# Patient Record
Sex: Female | Born: 2000 | Race: White | Hispanic: No | Marital: Single | Smoking: Never smoker
Health system: Southern US, Community
[De-identification: ages and names within clinical notes are randomized; demographics above are authoritative.]

## PROBLEM LIST (undated history)

## (undated) DIAGNOSIS — F32A Depression, unspecified: Secondary | ICD-10-CM

## (undated) DIAGNOSIS — N946 Dysmenorrhea, unspecified: Secondary | ICD-10-CM

## (undated) HISTORY — DX: Dysmenorrhea, unspecified: N94.6

## (undated) HISTORY — PX: HERNIA REPAIR: SHX51

## (undated) HISTORY — DX: Depression, unspecified: F32.A

---
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
## 2023-01-07 ENCOUNTER — Ambulatory Visit (INDEPENDENT_AMBULATORY_CARE_PROVIDER_SITE_OTHER): Payer: 59 | Admitting: Nurse Practitioner

## 2023-01-07 ENCOUNTER — Encounter: Payer: Self-pay | Admitting: Nurse Practitioner

## 2023-01-07 VITALS — BP 110/70 | HR 87 | Ht 63.0 in | Wt 125.0 lb

## 2023-01-07 DIAGNOSIS — Z30011 Encounter for initial prescription of contraceptive pills: Secondary | ICD-10-CM

## 2023-01-07 DIAGNOSIS — Z113 Encounter for screening for infections with a predominantly sexual mode of transmission: Secondary | ICD-10-CM

## 2023-01-07 DIAGNOSIS — F3281 Premenstrual dysphoric disorder: Secondary | ICD-10-CM | POA: Diagnosis not present

## 2023-01-07 DIAGNOSIS — N946 Dysmenorrhea, unspecified: Secondary | ICD-10-CM | POA: Diagnosis not present

## 2023-01-07 MED ORDER — NORETHIN ACE-ETH ESTRAD-FE 1-20 MG-MCG PO TABS: 1.0000 | ORAL_TABLET | Freq: Every day | ORAL | 1 refills | Status: DC

## 2023-01-07 NOTE — Progress Notes (Signed)
   Acute Office Visit  Subjective:    Patient ID: Julie Mcclure, female    DOB: 05/08/2001, 22 y.o.   MRN: [id number]   HPI 22 y.o. G0 presents as new patient for dysmenorrhea and mood changes. No history of diagnosed anxiety or depression. Has noticed the last 6+ months that she becomes very depressed about a week before her menses that will last through her period. Denies SI. Also feels very anxious and overwhelmed, can have intrusive thoughts. In school at [employer], works at [employer], living with parents. Does not feel she has any reason to be depressed and has noticed it occurs cyclically. Cycles are monthly. Pain radiates to back and even into rectum. Bleeding is moderate. Would like STD screening today, declines HIV, RPR.   Patient's last menstrual period was 12/23/2022. Period Cycle (Days): 28 Period Pattern: Regular Menstrual Flow: Moderate Menstrual Control: Tampon Menstrual Control Change Freq (Hours): 3 Dysmenorrhea: (!) Severe Dysmenorrhea Symptoms: Nausea, Cramping, Throbbing (severe back pain that caused her to pass out.)  Review of Systems  Constitutional: Negative.   Genitourinary:  Positive for menstrual problem.  Psychiatric/Behavioral:  Positive for dysphoric mood. Negative for self-injury and suicidal ideas. The patient is nervous/anxious.        Objective:    Physical Exam Constitutional:      Appearance: Normal appearance.  Genitourinary:    General: Normal vulva.  Psychiatric:        Attention and Perception: Attention normal.        Mood and Affect: Mood is anxious.        Speech: Speech normal.        Behavior: Behavior normal.     BP 110/70   Pulse 87   Ht 5\' 3"  (1.6 m)   Wt 125 lb (56.7 kg)   LMP 12/23/2022   SpO2 100%   BMI 22.14 kg/m  Wt Readings from Last 3 Encounters:  01/07/23 125 lb (56.7 kg) (45 %, Z= -0.14)*   * Growth percentiles are based on CDC (Girls, 2-20 Years) data.        Patient informed chaperone available to be  present for breast and/or pelvic exam. Patient has requested no chaperone to be present. Patient has been advised what will be completed during breast and pelvic exam.   Assessment & Plan:   Problem List Items Addressed This Visit   None Visit Diagnoses     PMDD (premenstrual dysphoric disorder)    -  Primary   Relevant Medications   norethindrone-ethinyl estradiol-FE (LOESTRIN FE) 1-20 MG-MCG tablet   Encounter for initial prescription of contraceptive pills       Relevant Medications   norethindrone-ethinyl estradiol-FE (LOESTRIN FE) 1-20 MG-MCG tablet   Screening examination for STD (sexually transmitted disease)       Relevant Orders   SURESWAB CT/NG/T. vaginalis   Dysmenorrhea       Relevant Medications   norethindrone-ethinyl estradiol-FE (LOESTRIN FE) 1-20 MG-MCG tablet      Plan: Discussed PMDD and management with hormonal contraception and/or SSRI. She would like to try birth control. Educated on proper use, slight risk for blood clots. Will start with first day of next menses. Option to take continuously if still experiencing symptoms during placebo week.  Ibuprofen as needed for cramping. STD panel pending. Follow up in 3 months.      Olivia Mackie DNP, 3:04 PM 01/07/2023

## 2023-01-08 LAB — SURESWAB CT/NG/T. VAGINALIS
C. trachomatis RNA, TMA: NOT DETECTED
N. gonorrhoeae RNA, TMA: NOT DETECTED
Trichomonas vaginalis RNA: NOT DETECTED

## 2023-04-12 ENCOUNTER — Ambulatory Visit: Payer: 59 | Admitting: Nurse Practitioner

## 2023-11-07 ENCOUNTER — Other Ambulatory Visit: Payer: Self-pay

## 2023-11-07 ENCOUNTER — Encounter (HOSPITAL_COMMUNITY): Payer: Self-pay

## 2023-11-07 ENCOUNTER — Emergency Department (HOSPITAL_COMMUNITY)
Admission: EM | Admit: 2023-11-07 | Discharge: 2023-11-08 | Disposition: A | Discharge status: Other Acute Inpatient Hospital | Source: Home / Self Care | Attending: Emergency Medicine | Admitting: Emergency Medicine

## 2023-11-07 DIAGNOSIS — F2081 Schizophreniform disorder: Secondary | ICD-10-CM | POA: Diagnosis not present

## 2023-11-07 DIAGNOSIS — F332 Major depressive disorder, recurrent severe without psychotic features: Secondary | ICD-10-CM | POA: Insufficient documentation

## 2023-11-07 DIAGNOSIS — F4310 Post-traumatic stress disorder, unspecified: Secondary | ICD-10-CM | POA: Diagnosis present

## 2023-11-07 DIAGNOSIS — R45851 Suicidal ideations: Secondary | ICD-10-CM | POA: Insufficient documentation

## 2023-11-07 DIAGNOSIS — F333 Major depressive disorder, recurrent, severe with psychotic symptoms: Secondary | ICD-10-CM | POA: Diagnosis present

## 2023-11-07 DIAGNOSIS — F419 Anxiety disorder, unspecified: Secondary | ICD-10-CM | POA: Insufficient documentation

## 2023-11-07 LAB — COMPREHENSIVE METABOLIC PANEL WITH GFR
ALT: 16 U/L (ref 0–44)
AST: 21 U/L (ref 15–41)
Albumin: 5 g/dL (ref 3.5–5.0)
Alkaline Phosphatase: 70 U/L (ref 38–126)
Anion gap: 9 (ref 5–15)
BUN: 10 mg/dL (ref 6–20)
CO2: 23 mmol/L (ref 22–32)
Calcium: 9.2 mg/dL (ref 8.9–10.3)
Chloride: 103 mmol/L (ref 98–111)
Creatinine, Ser: 0.6 mg/dL (ref 0.44–1.00)
GFR, Estimated: >60 mL/min (ref >60)
Glucose, Bld: 85 mg/dL (ref 70–99)
Potassium: 3.4 mmol/L — ABNORMAL LOW (ref 3.5–5.1)
Sodium: 135 mmol/L (ref 135–145)
Total Bilirubin: 0.8 mg/dL (ref 0.0–1.2)
Total Protein: 8.8 g/dL — ABNORMAL HIGH (ref 6.5–8.1)

## 2023-11-07 LAB — CBC
HCT: 47.2 % — ABNORMAL HIGH (ref 36.0–46.0)
Hemoglobin: 15.8 g/dL — ABNORMAL HIGH (ref 12.0–15.0)
MCH: 29.8 pg (ref 26.0–34.0)
MCHC: 33.5 g/dL (ref 30.0–36.0)
MCV: 88.9 fL (ref 80.0–100.0)
Platelets: 289 10*3/uL (ref 150–400)
RBC: 5.31 MIL/uL — ABNORMAL HIGH (ref 3.87–5.11)
RDW: 12.3 % (ref 11.5–15.5)
WBC: 6.9 10*3/uL (ref 4.0–10.5)
nRBC: 0 % (ref 0.0–0.2)

## 2023-11-07 LAB — RAPID URINE DRUG SCREEN, HOSP PERFORMED
Amphetamines: NOT DETECTED
Barbiturates: NOT DETECTED
Benzodiazepines: NOT DETECTED
Cocaine: NOT DETECTED
Opiates: NOT DETECTED
Tetrahydrocannabinol: NOT DETECTED

## 2023-11-07 LAB — ETHANOL: Alcohol, Ethyl (B): <10 mg/dL (ref <10)

## 2023-11-07 LAB — HCG, SERUM, QUALITATIVE: Preg, Serum: NEGATIVE

## 2023-11-07 LAB — ACETAMINOPHEN LEVEL: Acetaminophen (Tylenol), Serum: <10 ug/mL — ABNORMAL LOW (ref 10–30)

## 2023-11-07 LAB — SALICYLATE LEVEL: Salicylate Lvl: <7 mg/dL — ABNORMAL LOW (ref 7.0–30.0)

## 2023-11-07 MED ORDER — ACETAMINOPHEN 325 MG PO TABS
650.0000 mg | ORAL_TABLET | ORAL | Status: DC | PRN
  Administered 2023-11-08: 650 mg via ORAL
  Filled 2023-11-07: qty 2
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
## 2023-11-07 NOTE — ED Notes (Signed)
 Pt has been changed out and belongings have been placed in two bags and are located in the cabinets in triage below ice machine. Pt has been wanded by security.

## 2023-11-07 NOTE — BH Assessment (Signed)
 Patient was deferred to [facility] for a telepsych assessment. The assigned care coordinator will provide updates regarding the scheduling of the assessment. [facility]IS coordinator can be reached at [phone] for further information on the timing of the telepsych evaluation.

## 2023-11-07 NOTE — ED Provider Notes (Signed)
 [place] EMERGENCY DEPARTMENT AT [facility] Provider Note   CSN: [id number] Arrival date & time: 11/07/23  1941     History Chief Complaint  Patient presents with   Psychiatric Evaluation    HPI Julie Mcclure is a 23 y.o. female presenting for feelings of wanting to hurt herself.  SI and paranoia that someone is gonna come after her and that the apps on her phone are targeting her personally. Has been off medications for a while  Patient's recorded medical, surgical, social, medication list and allergies were reviewed in the Snapshot window as part of the initial history.   Review of Systems   Review of Systems  Constitutional:  Negative for chills and fever.  HENT:  Negative for ear pain and sore throat.   Eyes:  Negative for pain and visual disturbance.  Respiratory:  Negative for cough and shortness of breath.   Cardiovascular:  Negative for chest pain and palpitations.  Gastrointestinal:  Negative for abdominal pain and vomiting.  Genitourinary:  Negative for dysuria and hematuria.  Musculoskeletal:  Negative for arthralgias and back pain.  Skin:  Negative for color change and rash.  Neurological:  Negative for seizures and syncope.  Psychiatric/Behavioral:  Positive for agitation, behavioral problems and suicidal ideas. The patient is nervous/anxious.   All other systems reviewed and are negative.   Physical Exam Updated Vital Signs BP (!) 190/95 (BP Location: Left Arm)   Pulse (!) 105   Resp 20   Ht 5\' 3"  (1.6 m)   Wt 55.8 kg   LMP 10/06/2023   SpO2 99%   BMI 21.79 kg/m  Physical Exam Constitutional:      General: She is not in acute distress.    Appearance: She is not ill-appearing or toxic-appearing.  HENT:     Head: Normocephalic and atraumatic.  Eyes:     Extraocular Movements: Extraocular movements intact.     Pupils: Pupils are equal, round, and reactive to light.  Cardiovascular:     Rate and Rhythm: Normal rate.  Pulmonary:      Effort: No respiratory distress.  Abdominal:     General: Abdomen is flat.  Musculoskeletal:        General: No swelling, deformity or signs of injury.     Cervical back: Normal range of motion. No rigidity.  Skin:    General: Skin is warm and dry.  Neurological:     General: No focal deficit present.     Mental Status: She is alert and oriented to person, place, and time.  Psychiatric:        Mood and Affect: Mood normal.      ED Course/ Medical Decision Making/ A&P    Procedures Procedures   Medications Ordered in ED Medications  acetaminophen (TYLENOL) tablet 650 mg (has no administration in time range)   Medical Decision Making:   Julie Mcclure is a 23 y.o. female who presented to the ED today for psychiatric evaluation.  Patient is endorsing paranoia/SI.  Patient does have a history of similar for which they are on multiple medications.  They are not compliant with their medications.  On my initial exam, the pt was linear in thought, dysphoric in affect, and overall well-appearing.  Vital signs reviewed and reassuring.  Reviewed and confirmed nursing documentation for past medical history, family history, social history.    Initial Assessment:   This is most consistent with an acute life threatening illness. With the patient's presentation of SI/AH, patient warrants  emergent psychiatric consultation.  Differential includes primary psychosis, substance-induced psychosis, mood disturbance.  Initial Plan:  Patient immediately placed into ED psychiatric hold protocol including suicide precautions, elopement precautions and vital sign monitoring.    Emergent behavioral health hold signed and notarized while awaiting psychiatric consultation due to threat to self or others.  Psychiatry consulted for further evaluation once patient medically cleared.  Medical screening evaluation ordered and reviewed with no obvious medical reason to postpone psychiatric evaluation.  Patient  is voluntary at this time.  No IVC.  May need to be reassessed if capacity is changing.     Final Assessment and Plan:   Placed into psychiatric hold protocol and arranged for consultation. Medically cleared for psychiatric disposition.    Clinical Impression:  1. Anxiety   2. Suicidal ideation      Data Unavailable   Final Clinical Impression(s) / ED Diagnoses Final diagnoses:  Anxiety  Suicidal ideation    Rx / DC Orders ED Discharge Orders     None         Glyn Ade, MD 11/07/23 2029

## 2023-11-07 NOTE — ED Notes (Signed)
 Sent Red (pregnancy), Dark Green (ethanol) and lt green and lavendar (cbc and metabolic panel) to lab. Sent at 2015.

## 2023-11-08 ENCOUNTER — Inpatient Hospital Stay (HOSPITAL_COMMUNITY)
Admission: AD | Admit: 2023-11-08 | Discharge: 2023-11-16 | DRG: 885 | Disposition: A | Discharge status: Home / Self Care | Source: Intra-hospital | Attending: Psychiatry | Admitting: Psychiatry

## 2023-11-08 ENCOUNTER — Encounter (HOSPITAL_COMMUNITY): Payer: Self-pay | Admitting: Psychiatry

## 2023-11-08 ENCOUNTER — Encounter (HOSPITAL_COMMUNITY): Payer: Self-pay | Admitting: Nurse Practitioner

## 2023-11-08 DIAGNOSIS — F410 Panic disorder [episodic paroxysmal anxiety] without agoraphobia: Secondary | ICD-10-CM | POA: Diagnosis present

## 2023-11-08 DIAGNOSIS — R45851 Suicidal ideations: Secondary | ICD-10-CM

## 2023-11-08 DIAGNOSIS — F4310 Post-traumatic stress disorder, unspecified: Secondary | ICD-10-CM

## 2023-11-08 DIAGNOSIS — F411 Generalized anxiety disorder: Secondary | ICD-10-CM | POA: Diagnosis present

## 2023-11-08 DIAGNOSIS — F17200 Nicotine dependence, unspecified, uncomplicated: Secondary | ICD-10-CM | POA: Diagnosis present

## 2023-11-08 DIAGNOSIS — E559 Vitamin D deficiency, unspecified: Secondary | ICD-10-CM | POA: Diagnosis present

## 2023-11-08 DIAGNOSIS — F17290 Nicotine dependence, other tobacco product, uncomplicated: Secondary | ICD-10-CM | POA: Diagnosis present

## 2023-11-08 DIAGNOSIS — F333 Major depressive disorder, recurrent, severe with psychotic symptoms: Secondary | ICD-10-CM | POA: Diagnosis present

## 2023-11-08 DIAGNOSIS — G4700 Insomnia, unspecified: Secondary | ICD-10-CM | POA: Diagnosis present

## 2023-11-08 DIAGNOSIS — F2081 Schizophreniform disorder: Secondary | ICD-10-CM | POA: Diagnosis present

## 2023-11-08 DIAGNOSIS — Z818 Family history of other mental and behavioral disorders: Secondary | ICD-10-CM

## 2023-11-08 DIAGNOSIS — F332 Major depressive disorder, recurrent severe without psychotic features: Secondary | ICD-10-CM | POA: Diagnosis not present

## 2023-11-08 DIAGNOSIS — Z79899 Other long term (current) drug therapy: Secondary | ICD-10-CM

## 2023-11-08 DIAGNOSIS — F23 Brief psychotic disorder: Secondary | ICD-10-CM | POA: Diagnosis present

## 2023-11-08 MED ORDER — VENLAFAXINE HCL ER 37.5 MG PO CP24
37.5000 mg | ORAL_CAPSULE | Freq: Every day | ORAL | Status: DC
  Administered 2023-11-09: 37.5 mg via ORAL
  Filled 2023-11-08 (×3): qty 1

## 2023-11-08 MED ORDER — HYDROXYZINE HCL 25 MG PO TABS
25.0000 mg | ORAL_TABLET | Freq: Three times a day (TID) | ORAL | Status: DC | PRN
  Administered 2023-11-08 – 2023-11-14 (×4): 25 mg via ORAL
  Filled 2023-11-08 (×5): qty 1

## 2023-11-08 MED ORDER — HALOPERIDOL 5 MG PO TABS: 5.0000 mg | ORAL_TABLET | Freq: Three times a day (TID) | ORAL | Status: DC | PRN

## 2023-11-08 MED ORDER — NICOTINE POLACRILEX 2 MG MT GUM
2.0000 mg | CHEWING_GUM | OROMUCOSAL | Status: DC | PRN
  Administered 2023-11-09: 2 mg via ORAL

## 2023-11-08 MED ORDER — ALUM & MAG HYDROXIDE-SIMETH 200-200-20 MG/5ML PO SUSP: 30.0000 mL | ORAL | Status: DC | PRN

## 2023-11-08 MED ORDER — VENLAFAXINE HCL ER 75 MG PO CP24
75.0000 mg | ORAL_CAPSULE | Freq: Every day | ORAL | Status: DC
  Administered 2023-11-08: 75 mg via ORAL
  Filled 2023-11-08: qty 1

## 2023-11-08 MED ORDER — AMOXICILLIN 875 MG PO TABS: 875.0000 mg | ORAL_TABLET | Freq: Two times a day (BID) | ORAL | Status: DC

## 2023-11-08 MED ORDER — VENLAFAXINE HCL ER 150 MG PO CP24: 150.0000 mg | ORAL_CAPSULE | Freq: Every day | ORAL | Status: DC

## 2023-11-08 MED ORDER — VENLAFAXINE HCL ER 75 MG PO CP24: 150.0000 mg | ORAL_CAPSULE | Freq: Every day | ORAL | Status: DC

## 2023-11-08 MED ORDER — DIPHENHYDRAMINE HCL 50 MG/ML IJ SOLN: 50.0000 mg | Freq: Three times a day (TID) | INTRAMUSCULAR | Status: DC | PRN

## 2023-11-08 MED ORDER — HALOPERIDOL LACTATE 5 MG/ML IJ SOLN: 10.0000 mg | Freq: Three times a day (TID) | INTRAMUSCULAR | Status: DC | PRN

## 2023-11-08 MED ORDER — RISPERIDONE 0.25 MG PO TABS
0.2500 mg | ORAL_TABLET | Freq: Two times a day (BID) | ORAL | Status: DC
  Administered 2023-11-08 – 2023-11-09 (×2): 0.25 mg via ORAL
  Filled 2023-11-08 (×7): qty 1

## 2023-11-08 MED ORDER — LORAZEPAM 2 MG/ML IJ SOLN
2.0000 mg | Freq: Three times a day (TID) | INTRAMUSCULAR | Status: DC | PRN
Start: 2023-11-08 — End: 2023-11-16

## 2023-11-08 MED ORDER — ACETAMINOPHEN 325 MG PO TABS: 650.0000 mg | ORAL_TABLET | Freq: Four times a day (QID) | ORAL | Status: DC | PRN

## 2023-11-08 MED ORDER — HALOPERIDOL LACTATE 5 MG/ML IJ SOLN
5.0000 mg | Freq: Three times a day (TID) | INTRAMUSCULAR | Status: DC | PRN
Start: 2023-11-08 — End: 2023-11-16

## 2023-11-08 MED ORDER — ACETAMINOPHEN 325 MG PO TABS
650.0000 mg | ORAL_TABLET | ORAL | Status: DC | PRN
Start: 2023-11-08 — End: 2023-11-08

## 2023-11-08 MED ORDER — RISPERIDONE 0.5 MG PO TABS
0.2500 mg | ORAL_TABLET | Freq: Two times a day (BID) | ORAL | Status: DC
  Administered 2023-11-08: 0.25 mg via ORAL
  Filled 2023-11-08: qty 1

## 2023-11-08 MED ORDER — MAGNESIUM OXIDE -MG SUPPLEMENT 400 (240 MG) MG PO TABS
400.0000 mg | ORAL_TABLET | Freq: Every day | ORAL | Status: DC
  Filled 2023-11-08: qty 1

## 2023-11-08 MED ORDER — HYDROXYZINE HCL 25 MG PO TABS
25.0000 mg | ORAL_TABLET | Freq: Three times a day (TID) | ORAL | Status: DC | PRN
Start: 2023-11-08 — End: 2023-11-08

## 2023-11-08 MED ORDER — DIPHENHYDRAMINE HCL 25 MG PO CAPS: 50.0000 mg | ORAL_CAPSULE | Freq: Three times a day (TID) | ORAL | Status: DC | PRN

## 2023-11-08 MED ORDER — MAGNESIUM OXIDE -MG SUPPLEMENT 400 (240 MG) MG PO TABS
400.0000 mg | ORAL_TABLET | Freq: Every day | ORAL | Status: DC
  Administered 2023-11-09 – 2023-11-15 (×7): 400 mg via ORAL
  Filled 2023-11-08 (×10): qty 1

## 2023-11-08 NOTE — Plan of Care (Signed)
  Problem: Education: Goal: Verbalization of understanding the information provided will improve Outcome: Progressing   Problem: Activity: Goal: Interest or engagement in activities will improve Outcome: Progressing   Problem: Physical Regulation: Goal: Ability to maintain clinical measurements within normal limits will improve Outcome: Progressing   Problem: Safety: Goal: Periods of time without injury will increase Outcome: Progressing
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
## 2023-11-08 NOTE — ED Notes (Signed)
 Report given, Pt will be ready to Tx after a current DC after 1630 per staff
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
## 2023-11-08 NOTE — Plan of Care (Signed)
   Problem: Education: Goal: Knowledge of [facility] Education information/materials will improve Outcome: Progressing Goal: Verbalization of understanding the information provided will improve Outcome: Progressing

## 2023-11-08 NOTE — ED Provider Notes (Signed)
 Emergency Medicine Observation Re-evaluation Note  Julie Mcclure is a 23 y.o. female, seen on rounds today.  Pt initially presented to the ED for complaints of Psychiatric Evaluation Currently, the patient is awake.  Physical Exam  BP 113/82   Pulse 79   Temp 98.1 F (36.7 C) (Oral)   Resp 18   Ht 5\' 3"  (1.6 m)   Wt 55.8 kg   LMP 10/06/2023   SpO2 98%   BMI 21.79 kg/m  Physical Exam General: awake   ED Course / MDM  EKG:   I have reviewed the labs performed to date as well as medications administered while in observation.  Recent changes in the last 24 hours include nothing.  Plan  Current plan is for inpatient psych.    Virgina Norfolk, DO 11/08/23 [phone]

## 2023-11-08 NOTE — ED Notes (Signed)
 Mother requesting to be notified if patient is moved to inpatient hospital, patient agrees.

## 2023-11-08 NOTE — Progress Notes (Signed)
 Pt has been accepted to [facility] on 11/08/2023 Bed assignment: 402-2   Pt meets inpatient criteria per: Caleen Jobs NP   Attending Physician will be: Dr. Sarita Bottom, MD   Report can be called to:Adult unit: [phone]  Pt can arrive after discharges   Care Team Notified: [facility] Rona Ravens RN, Dahlia Byes NP. Jacquelynn Cree NT, [place] Carlena Sax RN, [place] Noe Fiscus RN, Dionisio Paschal NT, Yvone Neu RN  [place] Bralen Wiltgen LCSW-A   11/08/2023 11:24 AM

## 2023-11-08 NOTE — Group Note (Signed)
 Date:  11/08/2023 Time:  9:39 PM  Group Topic/Focus:  Wrap-Up Group:   The focus of this group is to help patients review their daily goal of treatment and discuss progress on daily workbooks.    Participation Level:  Active  Participation Quality:  Appropriate  Affect:  Appropriate  Cognitive:  Appropriate  Insight: Appropriate  Engagement in Group:  Engaged  Modes of Intervention:  Education and Exploration  Additional Comments:  Patient attended and participated in group tonight. She reports that she learn about her diagnosis. Her goal was to manage withdrawls.  Julie Mcclure [facility] 11/08/2023, 9:39 PM
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
## 2023-11-09 DIAGNOSIS — F17200 Nicotine dependence, unspecified, uncomplicated: Secondary | ICD-10-CM | POA: Diagnosis present

## 2023-11-09 DIAGNOSIS — F411 Generalized anxiety disorder: Secondary | ICD-10-CM | POA: Diagnosis present

## 2023-11-09 DIAGNOSIS — G4700 Insomnia, unspecified: Secondary | ICD-10-CM | POA: Diagnosis present

## 2023-11-09 DIAGNOSIS — F23 Brief psychotic disorder: Secondary | ICD-10-CM | POA: Diagnosis present

## 2023-11-09 DIAGNOSIS — F332 Major depressive disorder, recurrent severe without psychotic features: Secondary | ICD-10-CM

## 2023-11-09 LAB — HIV ANTIBODY (ROUTINE TESTING W REFLEX): HIV Screen 4th Generation wRfx: NONREACTIVE

## 2023-11-09 MED ORDER — VENLAFAXINE HCL ER 75 MG PO CP24
75.0000 mg | ORAL_CAPSULE | Freq: Every day | ORAL | Status: DC
  Administered 2023-11-10 – 2023-11-16 (×7): 75 mg via ORAL
  Filled 2023-11-09 (×10): qty 1

## 2023-11-09 MED ORDER — TRAZODONE HCL 50 MG PO TABS
50.0000 mg | ORAL_TABLET | Freq: Every day | ORAL | Status: DC
  Administered 2023-11-10 – 2023-11-15 (×6): 50 mg via ORAL
  Filled 2023-11-09 (×11): qty 1

## 2023-11-09 MED ORDER — NICOTINE 21 MG/24HR TD PT24
21.0000 mg | MEDICATED_PATCH | Freq: Every day | TRANSDERMAL | Status: DC
  Administered 2023-11-10 – 2023-11-15 (×5): 21 mg via TRANSDERMAL
  Filled 2023-11-09 (×10): qty 1

## 2023-11-09 MED ORDER — RISPERIDONE 0.5 MG PO TABS
0.5000 mg | ORAL_TABLET | Freq: Every day | ORAL | Status: DC
  Administered 2023-11-09: 0.5 mg via ORAL
  Filled 2023-11-09 (×3): qty 1

## 2023-11-09 NOTE — Group Note (Signed)
 Recreation Therapy Group Note   Group Topic:Animal Assisted Therapy   Group Date: 11/09/2023 Start Time: 0946 End Time: 1030 Facilitators: Cyprian Gongaware-McCall, LRT,CTRS Location: [address] Dayroom   Animal-Assisted Activity (AAA) Program Checklist/Progress Notes Patient Eligibility Criteria Checklist & Daily Group note for Rec Tx Intervention  AAA/T Program Assumption of Risk Form signed by Patient/ or Parent Legal Guardian Yes  Patient understands his/her participation is voluntary Yes  Education: Charity fundraiser, Appropriate Animal Interaction   Education Outcome: Acknowledges education.    Affect/Mood: N/A   Participation Level: Did not attend    Clinical Observations/Individualized Feedback:     Plan: Continue to engage patient in RT group sessions 2-3x/week.   Faust Thorington-McCall, LRT,CTRS 11/09/2023 2:21 PM

## 2023-11-09 NOTE — Plan of Care (Signed)
 Pt presents with flat expression and depressed affect. Pt complains of dizziness when receiving morning meds; pt took time to sit down and was given some gatorade to drink before going back to bed. Denies SI, HI, AVH, and pain. Guarded in interactions with staff but remains cooperative. Observed in the milieu throughout the shift. Medication compliant with no adverse reactions. Safety checks maintained at q 15 minutes. Support, encouragement, and reassurance offered to the pt.   Problem: Education: Goal: Emotional status will improve Outcome: Progressing Goal: Mental status will improve Outcome: Progressing Goal: Verbalization of understanding the information provided will improve Outcome: Progressing   Problem: Activity: Goal: Interest or engagement in activities will improve Outcome: Progressing   Problem: Safety: Goal: Periods of time without injury will increase Outcome: Progressing
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
## 2023-11-09 NOTE — [facility] Group Notes (Signed)
 [facility] Group Notes:  (Nursing/MHT/Case Management/Adjunct)  Date:  11/09/2023  Time:  11:20 PM  Type of Therapy:   Wrap-up group  Participation Level:  Active  Participation Quality:  Appropriate  Affect:  Appropriate  Cognitive:  Appropriate  Insight:  Appropriate  Engagement in Group:  Engaged  Modes of Intervention:  Education  Summary of Progress/Problems: Goal to be open minded. Rated her day 6/10.   Noah Delaine 11/09/2023, 11:20 PM

## 2023-11-09 NOTE — Plan of Care (Signed)
  Problem: Education: Goal: Knowledge of [facility] Education information/materials will improve Outcome: Progressing Goal: Emotional status will improve Outcome: Progressing Goal: Mental status will improve Outcome: Progressing  Patient is compliant with medications denies SI/HI/A/VH at present endorses anxiety Prn Vistaril given at HS. No medication adverse effect noted. Support and encouragement provided.

## 2023-11-09 NOTE — Group Note (Signed)
 LCSW Group Therapy Note   Group Date: 11/09/2023 Start Time: 1100 End Time: 1200   Participation:  did not attend  Type of Therapy:  Group Therapy  Title:  "Shining from Within: Confidence and Self-Love Journey"  Objective:  The focus of today's session is to explore how confidence and self-love can be nurtured over time through self-compassion, recognizing strengths, and taking small, intentional steps.  Goals: Foster self-love and acceptance by embracing strengths and imperfections. Develop confidence through actionable steps and mindset shifts. Practice patience during personal growth, acknowledging setbacks as part of the journey.  Summary:  This session focused on self-love as the foundation for confidence.  Participants practiced helpful self-talk, identified strengths, set small goals, and reflected on achievements and social support.  It emphasized that building confidence is a continuous process requiring patience and self-care.  Therapeutic Modalities: Cognitive Behavioral Therapy (CBT): Used to challenge and replace unhelpful self-talk with more supportive thoughts, enhancing self-esteem. Mindfulness and Self-Compassion Practices: Encourages reflection on strengths, gratitude, and the creation of a positive environment to foster a sense of well-being.   Alfredia Ferguson Mystie Ormand, LCSWA 11/09/2023  1:01 PM

## 2023-11-09 NOTE — Group Note (Signed)
 Date:  11/09/2023 Time:  11:24 AM  Group Topic/Focus:  Goals Group:   The focus of this group is to help patients establish daily goals to achieve during treatment and discuss how the patient can incorporate goal setting into their daily lives to aide in recovery.    Participation Level:  Did Not Attend  Participation Quality:  Did Not Attend  Affect:  Did Not Attend  Cognitive:  Did Not Attend  Insight: None  Engagement in Group:  Did Not Attend  Modes of Intervention:  Did Not Attend  Additional Comments:    Estill Dooms 11/09/2023, 11:24 AM

## 2023-11-10 ENCOUNTER — Encounter (HOSPITAL_COMMUNITY): Payer: Self-pay

## 2023-11-10 DIAGNOSIS — F332 Major depressive disorder, recurrent severe without psychotic features: Secondary | ICD-10-CM | POA: Diagnosis not present

## 2023-11-10 LAB — LIPID PANEL
Cholesterol: 164 mg/dL (ref 0–200)
HDL: 49 mg/dL (ref >40)
LDL Cholesterol: 103 mg/dL — ABNORMAL HIGH (ref 0–99)
Total CHOL/HDL Ratio: 3.3 ratio
Triglycerides: 62 mg/dL (ref <150)
VLDL: 12 mg/dL (ref 0–40)

## 2023-11-10 LAB — TSH: TSH: 1.396 u[IU]/mL (ref 0.350–4.500)

## 2023-11-10 LAB — VITAMIN B12: Vitamin B-12: 422 pg/mL (ref 180–914)

## 2023-11-10 LAB — RPR: RPR Ser Ql: NONREACTIVE

## 2023-11-10 LAB — VITAMIN D 25 HYDROXY (VIT D DEFICIENCY, FRACTURES): Vit D, 25-Hydroxy: 18.02 ng/mL — ABNORMAL LOW (ref 30–100)

## 2023-11-10 MED ORDER — RISPERIDONE 1 MG PO TABS
1.0000 mg | ORAL_TABLET | Freq: Every day | ORAL | Status: DC
  Administered 2023-11-10 – 2023-11-11 (×2): 1 mg via ORAL
  Filled 2023-11-10 (×5): qty 1

## 2023-11-10 NOTE — BH IP Treatment Plan (Signed)
 Interdisciplinary Treatment and Diagnostic Plan Update  11/10/2023 Time of Session: 1125am Lourdes Kucharski MRN: [id number]  Principal Diagnosis: Major depressive disorder, recurrent, severe without psychotic features (HCC)  Secondary Diagnoses: Principal Problem:   Major depressive disorder, recurrent, severe without psychotic features (HCC) Active Problems:   GAD (generalized anxiety disorder)   Nicotine dependence   Acute psychosis (HCC)   Insomnia   Current Medications:  Current Facility-Administered Medications  Medication Dose Route Frequency Provider Last Rate Last Admin   acetaminophen (TYLENOL) tablet 650 mg  650 mg Oral Q6H PRN Onuoha, Josephine C, NP       alum & mag hydroxide-simeth (MAALOX/MYLANTA) 200-200-20 MG/5ML suspension 30 mL  30 mL Oral Q4H PRN Onuoha, Josephine C, NP       haloperidol (HALDOL) tablet 5 mg  5 mg Oral TID PRN Dahlia Byes C, NP       And   diphenhydrAMINE (BENADRYL) capsule 50 mg  50 mg Oral TID PRN Dahlia Byes C, NP       haloperidol lactate (HALDOL) injection 5 mg  5 mg Intramuscular TID PRN Dahlia Byes C, NP       And   diphenhydrAMINE (BENADRYL) injection 50 mg  50 mg Intramuscular TID PRN Dahlia Byes C, NP       And   LORazepam (ATIVAN) injection 2 mg  2 mg Intramuscular TID PRN Dahlia Byes C, NP       haloperidol lactate (HALDOL) injection 10 mg  10 mg Intramuscular TID PRN Dahlia Byes C, NP       And   diphenhydrAMINE (BENADRYL) injection 50 mg  50 mg Intramuscular TID PRN Dahlia Byes C, NP       And   LORazepam (ATIVAN) injection 2 mg  2 mg Intramuscular TID PRN Earney Navy, NP       hydrOXYzine (ATARAX) tablet 25 mg  25 mg Oral TID PRN Dahlia Byes C, NP   25 mg at 11/09/23 2108   magnesium oxide (MAG-OX) tablet 400 mg  400 mg Oral QHS Dahlia Byes C, NP   400 mg at 11/09/23 2109   nicotine (NICODERM CQ - dosed in mg/24 hours) patch 21 mg  21 mg Transdermal Daily Starleen Blue, NP    21 mg at 11/10/23 0809   nicotine polacrilex (NICORETTE) gum 2 mg  2 mg Oral PRN Sarita Bottom, MD   2 mg at 11/09/23 1514   risperiDONE (RISPERDAL) tablet 0.5 mg  0.5 mg Oral QHS Izediuno, Delight Ovens, MD   0.5 mg at 11/09/23 2108   traZODone (DESYREL) tablet 50 mg  50 mg Oral QHS Starleen Blue, NP       venlafaxine XR (EFFEXOR-XR) 24 hr capsule 75 mg  75 mg Oral Q breakfast Starleen Blue, NP   75 mg at 11/10/23 0809   PTA Medications: Medications Prior to Admission  Medication Sig Dispense Refill Last Dose/Taking   Ibuprofen 200 MG CAPS Take 200-400 mg by mouth every 8 (eight) hours as needed (for pain or headaches).      norethindrone-ethinyl estradiol-FE (LOESTRIN FE) 1-20 MG-MCG tablet Take 1 tablet by mouth daily. (Patient not taking: Reported on 11/08/2023) 84 tablet 1     Patient Stressors: Marital or family conflict   Traumatic event    Patient Strengths: Average or above average intelligence  Motivation for treatment/growth   Treatment Modalities: Medication Management, Group therapy, Case management,  1 to 1 session with clinician, Psychoeducation, Recreational therapy.   Physician Treatment Plan for Primary  Diagnosis: Major depressive disorder, recurrent, severe without psychotic features (HCC) Long Term Goal(s): Improvement in symptoms so as ready for discharge   Short Term Goals: Ability to identify changes in lifestyle to reduce recurrence of condition will improve Ability to verbalize feelings will improve Ability to disclose and discuss suicidal ideas Ability to demonstrate self-control will improve Ability to identify and develop effective coping behaviors will improve Ability to maintain clinical measurements within normal limits will improve Compliance with prescribed medications will improve  Medication Management: Evaluate patient's response, side effects, and tolerance of medication regimen.  Therapeutic Interventions: 1 to 1 sessions, Unit Group sessions  and Medication administration.  Evaluation of Outcomes: Not Progressing  Physician Treatment Plan for Secondary Diagnosis: Principal Problem:   Major depressive disorder, recurrent, severe without psychotic features (HCC) Active Problems:   GAD (generalized anxiety disorder)   Nicotine dependence   Acute psychosis (HCC)   Insomnia  Long Term Goal(s): Improvement in symptoms so as ready for discharge   Short Term Goals: Ability to identify changes in lifestyle to reduce recurrence of condition will improve Ability to verbalize feelings will improve Ability to disclose and discuss suicidal ideas Ability to demonstrate self-control will improve Ability to identify and develop effective coping behaviors will improve Ability to maintain clinical measurements within normal limits will improve Compliance with prescribed medications will improve     Medication Management: Evaluate patient's response, side effects, and tolerance of medication regimen.  Therapeutic Interventions: 1 to 1 sessions, Unit Group sessions and Medication administration.  Evaluation of Outcomes: Not Progressing   RN Treatment Plan for Primary Diagnosis: Major depressive disorder, recurrent, severe without psychotic features (HCC) Long Term Goal(s): Knowledge of disease and therapeutic regimen to maintain health will improve  Short Term Goals: Ability to remain free from injury will improve, Ability to verbalize frustration and anger appropriately will improve, Ability to demonstrate self-control, Ability to participate in decision making will improve, Ability to verbalize feelings will improve, Ability to disclose and discuss suicidal ideas, Ability to identify and develop effective coping behaviors will improve, and Compliance with prescribed medications will improve  Medication Management: RN will administer medications as ordered by provider, will assess and evaluate patient's response and provide education to  patient for prescribed medication. RN will report any adverse and/or side effects to prescribing provider.  Therapeutic Interventions: 1 on 1 counseling sessions, Psychoeducation, Medication administration, Evaluate responses to treatment, Monitor vital signs and CBGs as ordered, Perform/monitor CIWA, COWS, AIMS and Fall Risk screenings as ordered, Perform wound care treatments as ordered.  Evaluation of Outcomes: Not Progressing   LCSW Treatment Plan for Primary Diagnosis: Major depressive disorder, recurrent, severe without psychotic features (HCC) Long Term Goal(s): Safe transition to appropriate next level of care at discharge, Engage patient in therapeutic group addressing interpersonal concerns.  Short Term Goals: Engage patient in aftercare planning with referrals and resources, Increase social support, Increase ability to appropriately verbalize feelings, Increase emotional regulation, Facilitate acceptance of mental health diagnosis and concerns, Facilitate patient progression through stages of change regarding substance use diagnoses and concerns, Identify triggers associated with mental health/substance abuse issues, and Increase skills for wellness and recovery  Therapeutic Interventions: Assess for all discharge needs, 1 to 1 time with Social worker, Explore available resources and support systems, Assess for adequacy in community support network, Educate family and significant other(s) on suicide prevention, Complete Psychosocial Assessment, Interpersonal group therapy.  Evaluation of Outcomes: Not Progressing   Progress in Treatment: Attending groups: No. Participating in groups:  No. Taking medication as prescribed: Yes. Toleration medication: Yes. Family/Significant other contact made: No, will contact:  pt declined consents, completed w patient Patient understands diagnosis: Yes. Discussing patient identified problems/goals with staff: Yes. Medical problems stabilized or  resolved: Yes. Denies suicidal/homicidal ideation: Yes. Issues/concerns per patient self-inventory: No.  New problem(s) identified: No, Describe:  none  New Short Term/Long Term Goal(s): medication stabilization, elimination of SI thoughts, development of comprehensive mental wellness plan.    Patient Goals:  "Attend more groups and work on my social anxiety"  Discharge Plan or Barriers: Patient recently admitted. CSW will continue to follow and assess for appropriate referrals and possible discharge planning.    Reason for Continuation of Hospitalization: Depression Medication stabilization Suicidal ideation Other; describe paranoia  Estimated Length of Stay: 5-7 days  Last 3 [place] Suicide Severity Risk Score: Flowsheet Row [facility] (Current[facility]07[facility]2023 in [facility]  C-SSRS RISK CATEGORY Moderate Risk High Risk       Last [facility] 2/9 Scores:     No data to display          Scribe for Treatment Team: Kathi Der, LCSWA 11/10/2023 1:27 PM

## 2023-11-10 NOTE — Plan of Care (Signed)

## 2023-11-10 NOTE — Progress Notes (Signed)
   11/09/23 2300  Psych Admission Type (Psych Patients Only)  Admission Status Voluntary  Psychosocial Assessment  Patient Complaints Anxiety;Depression (anxiety 6/10, depression 4/10)  Eye Contact Fair  Facial Expression Flat  Affect Depressed  Speech Soft  Interaction Guarded  Motor Activity Slow  Appearance/Hygiene Unremarkable  Behavior Characteristics Cooperative  Mood Depressed  Thought Process  Coherency WDL  Content WDL  Delusions None reported or observed  Perception WDL  Hallucination None reported or observed  Judgment Limited  Confusion None  Danger to Self  Current suicidal ideation? Denies  Agreement Not to Harm Self Yes  Description of Agreement verbal  Danger to Others  Danger to Others None reported or observed

## 2023-11-10 NOTE — [facility] Group Notes (Signed)
 [facility] Group Notes:  (Nursing/MHT/Case Management/Adjunct)  Date:  11/10/2023  Time:  2000  Type of Therapy:   Wrap up group  Participation Level:  Active  Participation Quality:  Appropriate and Attentive  Affect:  Appropriate  Cognitive:  Alert and Appropriate  Insight:  Appropriate and Good  Engagement in Group:  Engaged  Modes of Intervention:  Discussion  Summary of Progress/Problems:  Pt rate her day a 8.  Fay Records 11/10/2023, 9:00 PM
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
## 2023-11-10 NOTE — Progress Notes (Signed)
   11/10/23 0855  Psych Admission Type (Psych Patients Only)  Admission Status Voluntary  Psychosocial Assessment  Patient Complaints Anxiety  Eye Contact Brief  Facial Expression Anxious  Affect Anxious  Speech Soft  Interaction Guarded  Motor Activity Slow  Appearance/Hygiene Unremarkable  Behavior Characteristics Appropriate to situation  Mood Depressed  Thought Process  Coherency WDL  Content WDL  Delusions None reported or observed  Perception WDL  Hallucination None reported or observed  Judgment Limited  Confusion None  Danger to Self  Current suicidal ideation? Denies  Agreement Not to Harm Self Yes  Description of Agreement Verbal  Danger to Others  Danger to Others None reported or observed

## 2023-11-10 NOTE — [facility] Suicide Risk Assessment (Signed)
 [facility] INPATIENT:  Family/Significant Other Suicide Prevention Education  Suicide Prevention Education:  Patient Refusal for Family/Significant Other Suicide Prevention Education: The patient [name] has refused to provide written consent for family/significant other to be provided Family/Significant Other Suicide Prevention Education during admission and/or prior to discharge.  Physician notified.  Suicide Prevention Education was reviewed thoroughly with patient, including risk factors, warning signs, and what to do.  Mobile Crisis services were described and that telephone number pointed out, with encouragement to patient to put this number in personal cell phone.  Brochure was provided to patient to share with natural supports.  Patient acknowledged the ways in which they are at risk, and how working through each of their issues can gradually start to reduce their risk factors.  Patient was encouraged to think of the information in the context of people in their own lives.  Patient denied having access to firearms  Patient verbalized understanding of information provided.  Patient endorsed a desire to live.     Steffanie Dunn LCSWA 11/10/2023, 11:48 AM

## 2023-11-10 NOTE — Group Note (Signed)
 Recreation Therapy Group Note   Group Topic:Team Building  Group Date: 11/10/2023 Start Time: 0935 End Time: 1000 Facilitators: Jannessa Ogden-McCall, LRT,CTRS Location: [address] Dayroom   Group Topic: Communication, Team Building, Problem Solving  Goal Area(s) Addresses:  Patient will effectively work with peer towards shared goal.  Patient will identify skills used to make activity successful.  Patient will identify how skills used during activity can be used to reach post d/c goals.   Intervention: STEM Activity  Activity: Straw Bridge. In teams of 3-5, patients were given 15 plastic drinking straws and an equal length of masking tape. Using the materials provided, patients were instructed to build a free standing bridge-like structure to suspend an everyday item (ex: puzzle box) off of the floor or table surface. All materials were required to be used by the team in their design. LRT facilitated post-activity discussion reviewing team process. Patients were encouraged to reflect how the skills used in this activity can be generalized to daily life post discharge.   Education: Pharmacist, community, Scientist, physiological, Discharge Planning   Education Outcome: Acknowledges education/In group clarification offered/Needs additional education.    Affect/Mood: N/A   Participation Level: Did not attend    Clinical Observations/Individualized Feedback:     Plan: Continue to engage patient in RT group sessions 2-3x/week.   Ash Mcelwain-McCall, LRT,CTRS 11/10/2023 12:51 PM

## 2023-11-10 NOTE — Plan of Care (Signed)
   Problem: Education: Goal: Emotional status will improve Outcome: Progressing Goal: Mental status will improve Outcome: Progressing   Problem: Activity: Goal: Interest or engagement in activities will improve Outcome: Progressing Goal: Sleeping patterns will improve Outcome: Progressing   Problem: Safety: Goal: Periods of time without injury will increase Outcome: Progressing

## 2023-11-10 NOTE — Progress Notes (Signed)
 [facility] MD Progress Note  11/10/2023 4:29 PM Julie Mcclure  MRN:  [id number] Subjective:   23 year old Caucasian female, history of trauma. Presented voluntarily on account of paranoia. Presents with ideas of reference relating to social media postings. Patient is not therapy, she was recently switched from Escitalopram to venlafaxine. Routine labs are essentially normal. No psychoactive substances on board.  Chart reviewed today.  Patient discussed at multidisciplinary team meeting.  Nursing staff reports that patient slept for 7.75 hours.  No observed response to internal stimuli.  No challenging behavior.  No PRNs required.  Patient was seen today at team.  She reports being specifically targeted on By someone she suspects is a Photographer.  States that he drops hints on everything that she does.  States that the pattern has continued for over three months.  Patient reports anxiety relating to this person.  States that it was affecting her ability to sleep at home.  She has also felt confused about being traumatized by her brother.  Patient states that she feels better with recent introduction of risperidone.  She is not experiencing any adverse effects.  She was able to sleep better last night.  She does not feel persecuted by anyone in here.  She does not feel like they are talking about on TV.  She is not experiencing hallucinations in any modality.  No external interference with her thought process.  She does not have any suicidal thoughts.  No previous suicidal behavior.  She does not have violent thoughts towards anyone.  Patient wants to get better.  She agreed to optimization of her antipsychotic medication.  I have encouraged her to keep ventilating her feelings to staff.  Principal Problem: Major depressive disorder, recurrent, severe without psychotic features (HCC) Diagnosis: Principal Problem:   Major depressive disorder, recurrent, severe without psychotic features  (HCC) Active Problems:   GAD (generalized anxiety disorder)   Nicotine dependence   Acute psychosis (HCC)   Insomnia  Total Time spent with patient: 30 minutes  Past Psychiatric History:  See H&P  Past Medical History:  Past Medical History:  Diagnosis Date   Depression    Dysmenorrhea     Past Surgical History:  Procedure Laterality Date   HERNIA REPAIR     Family History:  Family History  Problem Relation Age of Onset   Breast cancer Neg Hx    Ovarian cancer Neg Hx    Family Psychiatric  History:  See H&P  Social History:  Social History   Substance and Sexual Activity  Alcohol Use Never     Social History   Substance and Sexual Activity  Drug Use Never    Social History   Socioeconomic History   Marital status: Single    Spouse name: Not on file   Number of children: Not on file   Years of education: Not on file   Highest education level: Not on file  Occupational History   Not on file  Tobacco Use   Smoking status: Never   Smokeless tobacco: Never  Vaping Use   Vaping status: Every Day   Substances: Nicotine  Substance and Sexual Activity   Alcohol use: Never   Drug use: Never   Sexual activity: Yes    Birth control/protection: Condom  Other Topics Concern   Not on file  Social History Narrative   Not on file   Social Drivers of Health   Financial Resource Strain: Not on file  Food Insecurity: No Food Insecurity (11/08/2023)  Hunger Vital Sign    Worried About Running Out of Food in the Last Year: Never true    Ran Out of Food in the Last Year: Never true  Transportation Needs: No Transportation Needs (11/08/2023)   PRAPARE - Administrator, Civil Service (Medical): No    Lack of Transportation (Non-Medical): No  Physical Activity: Not on file  Stress: Not on file  Social Connections: Not on file    Current Medications: Current Facility-Administered Medications  Medication Dose Route Frequency Provider Last Rate Last  Admin   acetaminophen (TYLENOL) tablet 650 mg  650 mg Oral Q6H PRN Onuoha, Josephine C, NP       alum & mag hydroxide-simeth (MAALOX/MYLANTA) 200-200-20 MG/5ML suspension 30 mL  30 mL Oral Q4H PRN Onuoha, Josephine C, NP       haloperidol (HALDOL) tablet 5 mg  5 mg Oral TID PRN Dahlia Byes C, NP       And   diphenhydrAMINE (BENADRYL) capsule 50 mg  50 mg Oral TID PRN Dahlia Byes C, NP       haloperidol lactate (HALDOL) injection 5 mg  5 mg Intramuscular TID PRN Dahlia Byes C, NP       And   diphenhydrAMINE (BENADRYL) injection 50 mg  50 mg Intramuscular TID PRN Dahlia Byes C, NP       And   LORazepam (ATIVAN) injection 2 mg  2 mg Intramuscular TID PRN Dahlia Byes C, NP       haloperidol lactate (HALDOL) injection 10 mg  10 mg Intramuscular TID PRN Dahlia Byes C, NP       And   diphenhydrAMINE (BENADRYL) injection 50 mg  50 mg Intramuscular TID PRN Dahlia Byes C, NP       And   LORazepam (ATIVAN) injection 2 mg  2 mg Intramuscular TID PRN Dahlia Byes C, NP       hydrOXYzine (ATARAX) tablet 25 mg  25 mg Oral TID PRN Dahlia Byes C, NP   25 mg at 11/09/23 2108   magnesium oxide (MAG-OX) tablet 400 mg  400 mg Oral QHS Onuoha, Josephine C, NP   400 mg at 11/09/23 2109   nicotine (NICODERM CQ - dosed in mg/24 hours) patch 21 mg  21 mg Transdermal Daily Starleen Blue, NP   21 mg at 11/10/23 1478   nicotine polacrilex (NICORETTE) gum 2 mg  2 mg Oral PRN Sarita Bottom, MD   2 mg at 11/09/23 1514   risperiDONE (RISPERDAL) tablet 0.5 mg  0.5 mg Oral QHS Kaleb Sek A, MD   0.5 mg at 11/09/23 2108   traZODone (DESYREL) tablet 50 mg  50 mg Oral QHS Nkwenti, Doris, NP       venlafaxine XR (EFFEXOR-XR) 24 hr capsule 75 mg  75 mg Oral Q breakfast Nkwenti, Doris, NP   75 mg at 11/10/23 2956    Lab Results:  Results for orders placed or performed during the hospital encounter of 11/08/23 (from the past 48 hours)  HIV Antibody (routine testing w  rflx)     Status: None   Collection Time: 11/09/23  6:46 PM  Result Value Ref Range   HIV Screen 4th Generation wRfx Non Reactive Non Reactive    Comment: Performed at [facility], [address] [place], [place] [address]  RPR     Status: None   Collection Time: 11/09/23  6:46 PM  Result Value Ref Range   RPR Ser Ql NON REACTIVE  NON REACTIVE    Comment: Performed at [facility], [address] [place], [place] [address]  VITAMIN D 25 Hydroxy (Vit-D Deficiency, Fractures)     Status: Abnormal   Collection Time: 11/10/23  6:28 AM  Result Value Ref Range   Vit D, 25-Hydroxy 18.02 (L) 30 - 100 ng/mL    Comment: (NOTE) Vitamin D deficiency has been defined by the Institute of Medicine  and an Endocrine Society practice guideline as a level of serum 25-OH  vitamin D less than 20 ng/mL (1,2). The Endocrine Society went on to  further define vitamin D insufficiency as a level between 21 and 29  ng/mL (2).  1. IOM (Institute of Medicine). 2010. Dietary reference intakes for  calcium and D. [place]: [employer]. 2. Holick MF, Binkley Kirkpatrick, Bischoff-Ferrari HA, et al. Evaluation,  treatment, and prevention of vitamin D deficiency: an Endocrine  Society clinical practice guideline, JCEM. 2011 Jul; 96(7): 1911-30.  Performed at [facility], [address] [place], [place] [address]   Vitamin B12     Status: None   Collection Time: 11/10/23  6:28 AM  Result Value Ref Range   Vitamin B-12 422 180 - 914 pg/mL    Comment: (NOTE) This assay is not validated for testing neonatal or myeloproliferative syndrome specimens for Vitamin B12 levels. Performed at [facility], [address] [place], [place] [address]   TSH     Status: None   Collection Time: 11/10/23  6:28 AM  Result Value Ref Range   TSH 1.396 0.350 - 4.500 uIU/mL    Comment: Performed by a 3rd Generation assay with a functional sensitivity of <=0.01  uIU/mL. Performed at [facility], [address], [place], [place] [address]   Lipid panel     Status: Abnormal   Collection Time: 11/10/23  6:28 AM  Result Value Ref Range   Cholesterol 164 0 - 200 mg/dL   Triglycerides 62 <295 mg/dL   HDL 49 >62 mg/dL   Total CHOL/HDL Ratio 3.3 RATIO   VLDL 12 0 - 40 mg/dL   LDL Cholesterol 130 (H) 0 - 99 mg/dL    Comment:        Total Cholesterol/HDL:CHD Risk Coronary Heart Disease Risk Table                     Men   Women  1/2 Average Risk   3.4   3.3  Average Risk       5.0   4.4  2 X Average Risk   9.6   7.1  3 X Average Risk  23.4   11.0        Use the calculated Patient Ratio above and the CHD Risk Table to determine the patient's CHD Risk.        ATP III CLASSIFICATION (LDL):  <100     mg/dL   Optimal  865-784  mg/dL   Near or Above                    Optimal  130-159  mg/dL   Borderline  696-295  mg/dL   High  >284     mg/dL   Very High Performed at [facility], [address] [place], [place] [address]     Blood Alcohol level:  Lab Results  Component Value Date   [facility] <10 11/07/2023    Metabolic Disorder Labs:  No results found for: "HGBA1C", "MPG" No results found for: "PROLACTIN" Lab Results  Component Value Date   CHOL 164 11/10/2023   TRIG 62 11/10/2023   HDL 49 11/10/2023   CHOLHDL 3.3 11/10/2023   VLDL 12 11/10/2023   LDLCALC 103 (H) 11/10/2023    Physical Findings: AIMS:  , ,  ,  ,    CIWA:    COWS:     Musculoskeletal: Strength & Muscle Tone: within normal limits Gait & Station: normal Patient leans: N/A  Psychiatric Specialty Exam:  Presentation  General Appearance:  Slim build, scans her environment suspiciously, not in any acute distress.  No EPS. Eye Contact: Fair  Speech: Spontaneous.  Soft spoken.  Mood and Affect  Mood: Overwhelmed by her subjective experience.  Affect: Blunted.  Thought Process  Thought Processes: Linear and goal  directed.  Descriptions of Associations:Intact  Orientation:Full (Time, Place and Person)  Thought Content: Persecutory and paranoid delusion.  Ideas of reference.  No current suicidal thoughts.  No homicidal thoughts.  No thoughts of violence.  No negative ruminative thoughts.  No guilty ruminations.  Hallucinations: No hallucination in any modality.  Sensorium  Memory: Did not assess at this time.  Judgment: Fair  Insight: Good.  Executive Functions  Concentration: Fair  Attention Span: Fair  Recall: Did not assess at this time.  Fund of Knowledge: Good.  Language: Good.  Psychomotor Activity  Decreased psychomotor activity.   Physical Exam: Physical Exam ROS Blood pressure 102/72, pulse 86, temperature 98.2 F (36.8 C), temperature source Oral, resp. rate 16, height 5\' 3"  (1.6 m), weight 57.6 kg, last menstrual period 10/06/2023, SpO2 100%. Body mass index is 22.5 kg/m.   Treatment Plan Summary: Patient presents with persecutory and paranoid delusion which has been going on for months.  She feels scared based on her abnormal beliefs.  There is associated mental confusion about things that happened with her brother.  She feels safe in here and has tolerated recent introduction of risperidone.  We will optimize risperidone as below.  We will gather collateral and evaluate her further.  1.  Increase risperidone to 1 mg at bedtime. 2.  Continue venlafaxine at 37.5 mg daily. 3.  Continue to encourage unit groups and therapeutic activities. 4.  Continue to monitor mood behavior and interaction with others. 5.  Social worker will gather collateral from her family. 6.  Social worker will coordinate discharge and aftercare planning.   Georgiann Cocker, MD 11/10/2023, 4:29 PM

## 2023-11-10 NOTE — Group Note (Signed)
 Date:  11/10/2023 Time:  9:17 AM  Group Topic/Focus:  Goals Group:   The focus of this group is to help patients establish daily goals to achieve during treatment and discuss how the patient can incorporate goal setting into their daily lives to aide in recovery.    Participation Level:  Did Not Attend  Erasmo Score 11/10/2023, 9:17 AM

## 2023-11-11 DIAGNOSIS — F332 Major depressive disorder, recurrent severe without psychotic features: Secondary | ICD-10-CM | POA: Diagnosis not present

## 2023-11-11 LAB — HEMOGLOBIN A1C
Hgb A1c MFr Bld: 5.4 % (ref 4.8–5.6)
Mean Plasma Glucose: 108 mg/dL

## 2023-11-11 NOTE — Plan of Care (Signed)
   Problem: Education: Goal: Knowledge of [facility] Education information/materials will improve Outcome: Progressing Goal: Emotional status will improve Outcome: Progressing Goal: Mental status will improve Outcome: Progressing Goal: Verbalization of understanding the information provided will improve Outcome: Progressing

## 2023-11-11 NOTE — Plan of Care (Signed)
   Problem: Education: Goal: Emotional status will improve Outcome: Progressing Goal: Mental status will improve Outcome: Progressing   Problem: Activity: Goal: Interest or engagement in activities will improve Outcome: Progressing Goal: Sleeping patterns will improve Outcome: Progressing   Problem: Safety: Goal: Periods of time without injury will increase Outcome: Progressing

## 2023-11-11 NOTE — [facility] Group Notes (Signed)
 [facility] Group Notes:  (Nursing/MHT/Case Management/Adjunct)  Date:  11/11/2023  Time:  2000  Type of Therapy:   wrap up group  Participation Level:  Active  Participation Quality:  Appropriate  Affect:  Appropriate  Cognitive:  Alert and Appropriate  Insight:  Appropriate and Good  Engagement in Group:  Engaged and Supportive  Modes of Intervention:  Discussion, Socialization, and Support  Summary of Progress/Problems:  Fay Records 11/11/2023, 9:07 PM
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
## 2023-11-11 NOTE — Plan of Care (Signed)
 Pt presents with flat expression and depressed affect. Denies SI, HI, AVH, and pain. Reports fair sleep and good appetite. Rates anxiety 4/10 and depression 6/10. Pt reports some cravings related to withdrawal and low energy levels. Cooperative and calm in interactions with staff. Medication compliant with no adverse reactions. Safety checks maintained at q 15 minutes. Support, encouragement, and reassurance offered to the pt.   Problem: Education: Goal: Emotional status will improve Outcome: Progressing Goal: Mental status will improve Outcome: Progressing Goal: Verbalization of understanding the information provided will improve Outcome: Progressing   Problem: Activity: Goal: Interest or engagement in activities will improve Outcome: Progressing   Problem: Safety: Goal: Periods of time without injury will increase Outcome: Progressing

## 2023-11-11 NOTE — Progress Notes (Signed)
   11/10/23 2300  Psych Admission Type (Psych Patients Only)  Admission Status Voluntary  Psychosocial Assessment  Patient Complaints Anxiety;Depression (anxiety 5/10, depression 4/10)  Eye Contact Fair  Facial Expression Flat  Affect Depressed  Speech Soft  Interaction Guarded  Motor Activity Slow  Appearance/Hygiene Unremarkable  Behavior Characteristics Cooperative;Appropriate to situation  Mood Depressed  Thought Process  Coherency WDL  Content WDL  Delusions None reported or observed  Perception WDL  Hallucination None reported or observed  Judgment Limited  Confusion None  Danger to Self  Current suicidal ideation? Denies  Agreement Not to Harm Self Yes  Description of Agreement verbal  Danger to Others  Danger to Others None reported or observed

## 2023-11-12 DIAGNOSIS — F332 Major depressive disorder, recurrent severe without psychotic features: Secondary | ICD-10-CM | POA: Diagnosis not present

## 2023-11-12 MED ORDER — RISPERIDONE 2 MG PO TABS
2.0000 mg | ORAL_TABLET | Freq: Every day | ORAL | Status: DC
  Administered 2023-11-12 – 2023-11-13 (×2): 2 mg via ORAL
  Filled 2023-11-12 (×5): qty 1

## 2023-11-12 NOTE — Plan of Care (Signed)
 ?  Problem: Activity: ?Goal: Interest or engagement in activities will improve ?Outcome: Progressing ?  ?Problem: Coping: ?Goal: Ability to verbalize frustrations and anger appropriately will improve ?Outcome: Progressing ?  ?Problem: Coping: ?Goal: Ability to demonstrate self-control will improve ?Outcome: Progressing ?  ?

## 2023-11-12 NOTE — Group Note (Signed)
 Recreation Therapy Group Note   Group Topic:Leisure Education  Group Date: 11/12/2023 Start Time: 0930 End Time: 0955 Facilitators: Nazeer Romney-McCall, LRT,CTRS Location: [address] Dayroom   Group Topic: Leisure Education  Goal Area(s) Addresses:  Patient will identify positive leisure activities for use post discharge. Patient will identify at least one positive benefit of participation in leisure activities.  Patient will work effectively work with peers in making sure activity goes as planned.  Intervention: [facility]   Activity: Patients were placed in a circle in the dayroom and given a beach ball. Patients were to play a game where they were to keep the ball moving at all times. LRT would time the group as they play the game. If the ball were to come to a stop, the time would start over.  Education:  Leisure Scientist, physiological, Special educational needs teacher, Teamwork, Discharge Planning  Education Outcome: Acknowledges education/In group clarification offered/Needs additional education.    Affect/Mood: N/A   Participation Level: Did not attend    Clinical Observations/Individualized Feedback:    Plan: Continue to engage patient in RT group sessions 2-3x/week.   Phyllip Claw-McCall, LRT,CTRS 11/12/2023 12:38 PM

## 2023-11-12 NOTE — BH Assessment (Signed)
 Patient alert and oriented at the beginning of shift. Attended group and had snacks with other patients. Came to window and took meds without difficulty. Denies SI/HI, and AVH. Pt was a little tearful stateing she would like to go home and states she is ready

## 2023-11-12 NOTE — [facility] Group Notes (Signed)
 Adult Psychoeducational Group Note  Date:  11/12/2023 Time:  8:59 PM  Group Topic/Focus:  Wrap-Up Group:   The focus of this group is to help patients review their daily goal of treatment and discuss progress on daily workbooks.  Participation Level:  Active  Participation Quality:  Attentive  Affect:  Appropriate  Cognitive:  Appropriate  Insight: Appropriate  Engagement in Group:  Engaged  Modes of Intervention:  Discussion  Additional Comments:  Patient attended and participated in the Wrap-up group.  Jearl Klinefelter 11/12/2023, 8:59 PM

## 2023-11-12 NOTE — Progress Notes (Signed)
   11/12/23 0958  Psych Admission Type (Psych Patients Only)  Admission Status Voluntary  Psychosocial Assessment  Patient Complaints Anxiety;Depression  Eye Contact Fair  Facial Expression Flat;Worried  Affect Depressed  Speech Soft  Interaction Minimal  Motor Activity Other (Comment) (WNL)  Appearance/Hygiene Unremarkable  Behavior Characteristics Cooperative;Calm  Mood Depressed  Thought Process  Coherency Circumstantial  Content WDL  Delusions None reported or observed  Perception WDL  Hallucination None reported or observed  Judgment WDL  Confusion None  Danger to Self  Current suicidal ideation? Denies  Agreement Not to Harm Self Yes  Description of Agreement verbally contracts for safety  Danger to Others  Danger to Others None reported or observed

## 2023-11-12 NOTE — Group Note (Signed)
 Date:  11/12/2023 Time:  8:37 AM  Group Topic/Focus:  Emotional Education:   The focus of this group is to discuss what feelings/emotions are, and how they are experienced.    Participation Level:  Did Not Attend  Erasmo Score 11/12/2023, 8:37 AM

## 2023-11-12 NOTE — [facility] Suicide Risk Assessment (Addendum)
 [facility] INPATIENT:  Family/Significant Other Suicide Prevention Education  Suicide Prevention Education:  Education Completed;  Serafina Royals (mother) [phone],  (name of family member/significant other) has been identified by the patient as the family member/significant other with whom the patient will be residing, and identified as the person(s) who will aid the patient in the event of a mental health crisis (suicidal ideations/suicide attempt).  With written consent from the patient, the family member/significant other has been provided the following suicide prevention education, prior to the and/or following the discharge of the patient.  The suicide prevention education provided includes the following: Suicide risk factors Suicide prevention and interventions National Suicide Hotline telephone number [facility] assessment telephone number [facility] Emergency Assistance 911 [facility] and/or Residential Mobile Crisis Unit telephone number  Request made of family/significant other to: Remove weapons (e.g., guns, rifles, knives), all items previously/currently identified as safety concern.   Remove drugs/medications (over-the-counter, prescriptions, illicit drugs), all items previously/currently identified as a safety concern.  The family member/significant other verbalizes understanding of the suicide prevention education information provided.  The family member/significant other agrees to remove the items of safety concern listed above.  Kathi Der 11/12/2023, 2:46 PM
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
## 2023-11-13 DIAGNOSIS — F332 Major depressive disorder, recurrent severe without psychotic features: Secondary | ICD-10-CM | POA: Diagnosis not present

## 2023-11-13 NOTE — Progress Notes (Signed)
 Pt visible in the milieu.   Interacting appropriately with staff and peers.  Pt stated she didn't attend groups today because of feeling down. She stated she is going to attend tomorrow.  Pt rated anxiety/depression 4/10.  Support provided.  Pt receptive.  Pt denied SI, HI and AVH.  Needs assessed.  Pt denied.  Security checks continue.  Pt safe on unit.

## 2023-11-13 NOTE — Group Note (Signed)
 Date:  11/13/2023 Time:  8:35 PM  Group Topic/Focus:  Wrap-Up Group:   The focus of this group is to help patients review their daily goal of treatment and discuss progress on daily workbooks.    Participation Level:  Active  Participation Quality:  Attentive  Affect:  Appropriate  Cognitive:  Appropriate  Insight: Appropriate  Engagement in Group:  Engaged  Modes of Intervention:  Discussion  Additional Comments:  Patient state she hade a 7 out of 10 day.  She felt like she made great progress with her mental helalth  Moishe Angel 11/13/2023, 8:35 PM

## 2023-11-13 NOTE — Group Note (Signed)
 Date:  11/13/2023 Time:  9:18 AM  Group Topic/Focus:  Overcoming Stress:   The focus of this group is to define stress and help patients assess their triggers.    Participation Level:  Active  Participation Quality:  Appropriate  Affect:  Appropriate  Ellan Gunner 11/13/2023, 9:18 AM

## 2023-11-13 NOTE — Plan of Care (Signed)
  Problem: Coping: Goal: Ability to demonstrate self-control will improve Outcome: Progressing
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
## 2023-11-13 NOTE — Progress Notes (Signed)
 D: Patient is alert, oriented, pleasant, and cooperative. Denies SI, HI, AVH, and verbally contracts for safety. Patient denies physical symptoms/pain.    A: Scheduled medications administered per MD order. Support provided. Patient educated on safety on the unit and medications. Routine safety checks every 15 minutes. Patient stated understanding to tell nurse about any new physical symptoms. Patient understands to tell staff of any needs.     R: No adverse drug reactions noted. Patient remains safe at this time and will continue to monitor.    11/13/23 0900  Psych Admission Type (Psych Patients Only)  Admission Status Voluntary  Psychosocial Assessment  Patient Complaints None  Eye Contact Fair  Facial Expression Flat  Affect Appropriate to circumstance  Speech Logical/coherent  Interaction Assertive  Motor Activity Other (Comment) (WNL)  Appearance/Hygiene Unremarkable  Behavior Characteristics Cooperative;Calm  Mood Depressed;Pleasant  Thought Process  Coherency WDL  Content WDL  Delusions None reported or observed  Perception WDL  Hallucination None reported or observed  Judgment Impaired  Confusion None  Danger to Self  Current suicidal ideation? Denies  Agreement Not to Harm Self Yes  Description of Agreement verbal  Danger to Others  Danger to Others None reported or observed

## 2023-11-13 NOTE — Plan of Care (Signed)

## 2023-11-13 NOTE — Group Note (Signed)
 Date:  11/13/2023 Time:  2:02 PM  Group Topic/Focus:  Wellness Toolbox:   The focus of this group is to discuss various aspects of wellness, balancing those aspects and exploring ways to increase the ability to experience wellness.  Patients will create a wellness toolbox for use upon discharge.    Participation Level:  Active   Ellan Gunner 11/13/2023, 2:02 PM

## 2023-11-14 DIAGNOSIS — F2081 Schizophreniform disorder: Principal | ICD-10-CM | POA: Insufficient documentation

## 2023-11-14 MED ORDER — RISPERIDONE 3 MG PO TABS
3.0000 mg | ORAL_TABLET | Freq: Every day | ORAL | Status: DC
  Administered 2023-11-14 – 2023-11-15 (×2): 3 mg via ORAL
  Filled 2023-11-14 (×4): qty 1

## 2023-11-14 NOTE — Progress Notes (Signed)
   11/14/23 2045  Psych Admission Type (Psych Patients Only)  Admission Status Voluntary  Psychosocial Assessment  Patient Complaints Anxiety  Eye Contact Fair  Facial Expression Anxious  Affect Appropriate to circumstance  Speech Logical/coherent  Interaction Assertive  Motor Activity Slow  Appearance/Hygiene Unremarkable  Behavior Characteristics Cooperative  Mood Pleasant  Aggressive Behavior  Effect No apparent injury  Thought Process  Coherency WDL  Content WDL  Delusions WDL  Perception WDL  Hallucination None reported or observed  Judgment Impaired  Confusion WDL  Danger to Self  Current suicidal ideation? Denies  Danger to Others  Danger to Others None reported or observed

## 2023-11-14 NOTE — Plan of Care (Signed)
   Problem: Activity: Goal: Interest or engagement in activities will improve Outcome: Progressing   Problem: Coping: Goal: Ability to verbalize frustrations and anger appropriately will improve Outcome: Progressing   Problem: Safety: Goal: Periods of time without injury will increase Outcome: Progressing

## 2023-11-14 NOTE — Group Note (Signed)
 [facility] LCSW Group Therapy Note  @TD @  1:00-2:00  Type of Therapy and Topic:  Group Therapy:  Taking Good Care of Yourself  Participation Level:  Active   Description of Group:  Patients in this group were introduced to the idea of adding a variety of healthy supports to address the various needs in their lives.  Different types of support were defined and described, and each type was acted out.  Patients discussed what additional healthy supports could be helpful in their recovery and wellness after discharge in order to prevent future hospitalizations.   An emphasis was placed on following up with the discharge plan when they leave the hospital in order to continue becoming healthier and happier.    Therapeutic Goals: 1)  demonstrate the importance of self-care  2)  What the patient dose to take care of themself  3)  Identify the benefit of selfcare  4)  Understand that asking for help is okay...   Summary of Patient Progress:  The patient expressed she comprehension of the concepts presented, and understand that there is a need to add more supports.  Current supports include taking care of herself.  The patient indicated one healthy support that could be helpful if added would be write in her journal.  Shee participated verbalized her understnading.  Therapeutic Modalities:   Psychoeducation Brief Solution-Focused Therapy

## 2023-11-14 NOTE — Group Note (Signed)
 Date:  11/14/2023 Time:  9:24 AM  Group Topic/Focus:  Healthy Communication:   The focus of this group is to discuss communication, barriers to communication, as well as healthy ways to communicate with others. Orientation:   The focus of this group is to educate the patient on the purpose and policies of crisis stabilization and provide a format to answer questions about their admission.  The group details unit policies and expectations of patients while admitted.    Participation Level:  Active  Participation Quality:  Appropriate  Affect:  Appropriate  Cognitive:  Appropriate  Insight: Good  Engagement in Group:  Engaged  Modes of Intervention:  Discussion and Orientation  Additional Comments:    Violette Grief 11/14/2023, 9:24 AM

## 2023-11-14 NOTE — Plan of Care (Signed)
   Problem: Education: Goal: Emotional status will improve Outcome: Progressing Goal: Mental status will improve Outcome: Progressing   Problem: Activity: Goal: Interest or engagement in activities will improve Outcome: Progressing Goal: Sleeping patterns will improve Outcome: Progressing

## 2023-11-14 NOTE — Progress Notes (Signed)
   11/13/23 2100  Psych Admission Type (Psych Patients Only)  Admission Status Voluntary  Psychosocial Assessment  Patient Complaints Decreased concentration;Other (Comment) (Pt verbalized that she does not understand her treatment plan. Provided paper for pt to write her questions down and encouraged her to voice concerns and get clarification in treatment team meeting. Pt verbalized understanding. No psychosis noted.)  Eye Contact Fair  Facial Expression Other (Comment) (appropriately expressive)  Affect Appropriate to circumstance  Speech Logical/coherent  Interaction Cautious;Other (Comment) (Hesitant)  Appearance/Hygiene Unremarkable  Behavior Characteristics Cooperative;Calm  Mood Euthymic;Pleasant  Thought Process  Coherency WDL  Content WDL  Delusions None reported or observed  Perception WDL  Hallucination None reported or observed  Judgment Impaired  Confusion None  Danger to Self  Current suicidal ideation? Denies  Agreement Not to Harm Self Yes  Description of Agreement Verbal  Danger to Others  Danger to Others None reported or observed

## 2023-11-14 NOTE — Progress Notes (Signed)
   11/14/23 0825  Psych Admission Type (Psych Patients Only)  Admission Status Voluntary  Psychosocial Assessment  Patient Complaints Anxiety  Eye Contact Fair  Facial Expression Other (Comment) (Bright)  Affect Appropriate to circumstance  Speech Logical/coherent  Interaction Assertive  Motor Activity Slow  Appearance/Hygiene Unremarkable  Behavior Characteristics Cooperative;Appropriate to situation;Anxious  Thought Process  Coherency WDL  Content WDL  Delusions None reported or observed  Perception WDL  Hallucination None reported or observed  Judgment Impaired  Confusion None  Danger to Self  Current suicidal ideation? Denies  Agreement Not to Harm Self Yes  Description of Agreement Verbal  Danger to Others  Danger to Others None reported or observed

## 2023-11-14 NOTE — [facility] Group Notes (Signed)
 [facility] Group Notes:  (Nursing/MHT/Case Management/Adjunct)  Date:  11/14/2023  Time:  9:39 PM  Type of Therapy:   Wrap-up group  Participation Level:  Active  Participation Quality:  Appropriate  Affect:  Appropriate  Cognitive:  Appropriate  Insight:  Appropriate  Engagement in Group:  Engaged  Modes of Intervention:  Education  Summary of Progress/Problems:Goal to attend group. Rated her day 4/10.   Alvaro Augusta 11/14/2023, 9:39 PM
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
## 2023-11-14 NOTE — Plan of Care (Signed)
  Problem: Education: Goal: Emotional status will improve Outcome: Progressing Goal: Mental status will improve Outcome: Progressing   Problem: Coping: Goal: Ability to verbalize frustrations and anger appropriately will improve Outcome: Progressing Goal: Ability to demonstrate self-control will improve Outcome: Progressing   

## 2023-11-15 ENCOUNTER — Encounter (HOSPITAL_COMMUNITY): Payer: Self-pay

## 2023-11-15 DIAGNOSIS — F2081 Schizophreniform disorder: Secondary | ICD-10-CM | POA: Diagnosis not present

## 2023-11-15 LAB — URINALYSIS, ROUTINE W REFLEX MICROSCOPIC
Bilirubin Urine: NEGATIVE
Glucose, UA: NEGATIVE mg/dL
Hgb urine dipstick: NEGATIVE
Ketones, ur: NEGATIVE mg/dL
Leukocytes,Ua: NEGATIVE
Nitrite: NEGATIVE
Protein, ur: NEGATIVE mg/dL
Specific Gravity, Urine: 1.024 (ref 1.005–1.030)
pH: 5 (ref 5.0–8.0)

## 2023-11-15 MED ORDER — VITAMIN D3 25 MCG PO TABS
2000.0000 [IU] | ORAL_TABLET | Freq: Two times a day (BID) | ORAL | Status: DC
  Administered 2023-11-16: 2000 [IU] via ORAL
  Filled 2023-11-15 (×5): qty 2

## 2023-11-15 MED ORDER — VITAMIN D (ERGOCALCIFEROL) 1.25 MG (50000 UNIT) PO CAPS
50000.0000 [IU] | ORAL_CAPSULE | ORAL | Status: DC
  Administered 2023-11-15: 50000 [IU] via ORAL
  Filled 2023-11-15: qty 1

## 2023-11-15 NOTE — [facility] Group Notes (Signed)
 [facility] Group Notes:  (Nursing/MHT/Case Management/Adjunct)  Date:  11/15/2023  Time:  9:13 PM  Type of Therapy:  Group Therapy  Participation Level:  Active  Participation Quality:  Appropriate and Attentive  Affect:  Appropriate  Cognitive:  Alert and Appropriate  Insight:  Appropriate and Good  Engagement in Group:  Engaged  Modes of Intervention:  Activity and Discussion  Summary of Progress/Problems: Wrap up group  Anniece Kind 11/15/2023, 9:13 PM

## 2023-11-15 NOTE — Group Note (Signed)
 Occupational Therapy Group Note  Group Topic: Sleep Hygiene  Group Date: 11/15/2023 Start Time: 1422 End Time: 1558 Facilitators: Lynnda Sas, OT   Group Description: Group encouraged increased participation and engagement through topic focused on sleep hygiene. Patients reflected on the quality of sleep they typically receive and identified areas that need improvement. Group was given background information on sleep and sleep hygiene, including common sleep disorders. Group members also received information on how to improve one's sleep and introduced a sleep diary as a tool that can be utilized to track sleep quality over a length of time. Group session ended with patients identifying one or more strategies they could utilize or implement into their sleep routine in order to improve overall sleep quality.        Therapeutic Goal(s):  Identify one or more strategies to improve overall sleep hygiene  Identify one or more areas of sleep that are negatively impacted (sleep too much, too little, etc)     Participation Level: Engaged   Participation Quality: Independent   Behavior: Appropriate   Speech/Thought Process: Relevant   Affect/Mood: Appropriate   Insight: Fair   Judgement: Fair      Modes of Intervention: Education  Patient Response to Interventions:  Attentive   Plan: Continue to engage patient in OT groups 2 - 3x/week.  11/15/2023  Lynnda Sas, OT  Julie Mcclure, OT

## 2023-11-15 NOTE — Progress Notes (Signed)
   11/15/23 0800  Psych Admission Type (Psych Patients Only)  Admission Status Voluntary  Psychosocial Assessment  Patient Complaints Anxiety  Eye Contact Fair  Facial Expression Anxious  Affect Appropriate to circumstance  Speech Logical/coherent  Interaction Assertive  Motor Activity Slow  Appearance/Hygiene Unremarkable  Behavior Characteristics Cooperative;Appropriate to situation  Mood Pleasant  Aggressive Behavior  Effect No apparent injury  Thought Process  Coherency WDL  Content WDL  Delusions WDL  Perception WDL  Hallucination None reported or observed  Judgment Impaired  Confusion WDL  Danger to Self  Current suicidal ideation? Denies  Danger to Others  Danger to Others None reported or observed

## 2023-11-15 NOTE — Group Note (Signed)
 Recreation Therapy Group Note   Group Topic:Stress Management  Group Date: 11/15/2023 Start Time: 0930 End Time: 0954 Facilitators: Truitt Cruey-McCall, LRT,CTRS Location: [address] Dayroom   Group Topic: Stress Management  Goal Area(s) Addresses:  Patient will identify positive stress management techniques. Patient will identify benefits of using stress management post d/c.  Intervention: Insight Timer App  Activity: Meditation. LRT played a meditation for patients that focused on morning energy and focus. Patients were to listen and follow along as the meditation took them on a journey to prepare them for the day ahead.    Education:  Stress Management, Discharge Planning.   Education Outcome: Acknowledges Education   Affect/Mood: N/A   Participation Level: Did not attend    Clinical Observations/Individualized Feedback:     Plan: Continue to engage patient in RT group sessions 2-3x/week.   Jeanet Lupe-McCall, LRT,CTRS 11/15/2023 1:16 PM
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
## 2023-11-15 NOTE — Plan of Care (Signed)
   Problem: Education: Goal: Emotional status will improve Outcome: Progressing Goal: Mental status will improve Outcome: Progressing   Problem: Activity: Goal: Interest or engagement in activities will improve Outcome: Progressing Goal: Sleeping patterns will improve Outcome: Progressing

## 2023-11-15 NOTE — Progress Notes (Signed)
   11/15/23 2015  Psych Admission Type (Psych Patients Only)  Admission Status Voluntary  Psychosocial Assessment  Patient Complaints Anxiety  Eye Contact Fair  Facial Expression Anxious  Affect Appropriate to circumstance  Speech Logical/coherent  Interaction Assertive  Motor Activity Slow  Appearance/Hygiene Unremarkable  Behavior Characteristics Cooperative  Mood Pleasant  Aggressive Behavior  Effect No apparent injury  Thought Process  Coherency WDL  Content WDL  Delusions WDL  Perception WDL  Hallucination None reported or observed  Judgment Impaired  Confusion WDL  Danger to Self  Current suicidal ideation? Denies  Danger to Others  Danger to Others None reported or observed

## 2023-11-15 NOTE — [facility] Group Notes (Signed)
 Spiritual care group on grief and loss facilitated by Chaplain Nick Barman, [facility]  Group Goal: Support / Education around grief and loss  Members engage in facilitated group support and psycho-social education.  Group Description:  Following introductions and group rules, group members engaged in facilitated group dialogue and support around topic of loss, with particular support around experiences of loss in their lives. Group Identified types of loss (relationships / self / things) and identified patterns, circumstances, and changes that precipitate losses. Reflected on thoughts / feelings around loss, normalized grief responses, and recognized variety in grief experience. Group encouraged individual reflection on safe space and on the coping skills that they are already utilizing.  Group drew on Adlerian / Rogerian and narrative framework  Patient Progress: Julie Mcclure attended group and actively engaged and participated in group conversation and activities.

## 2023-11-15 NOTE — Group Note (Signed)
 Date:  11/15/2023 Time:  9:32 AM  Group Topic/Focus:  Goals Group:   The focus of this group is to help patients establish daily goals to achieve during treatment and discuss how the patient can incorporate goal setting into their daily lives to aide in recovery.    Participation Level:  Did Not Attend  Julie Mcclure 11/15/2023, 9:32 AM

## 2023-11-15 NOTE — Plan of Care (Signed)
  Problem: Activity: Goal: Interest or engagement in activities will improve 11/15/2023 0914 by Jeoffrey Mole, RN Outcome: Progressing 11/15/2023 0913 by Jeoffrey Mole, RN Outcome: Progressing   Problem: Physical Regulation: Goal: Ability to maintain clinical measurements within normal limits will improve 11/15/2023 0914 by Jeoffrey Mole, RN Outcome: Progressing 11/15/2023 0913 by Jeoffrey Mole, RN Outcome: Progressing

## 2023-11-15 NOTE — Plan of Care (Signed)
   Problem: Coping: Goal: Ability to verbalize frustrations and anger appropriately will improve Outcome: Progressing   Problem: Safety: Goal: Periods of time without injury will increase Outcome: Progressing   Problem: Activity: Goal: Interest or engagement in activities will improve Outcome: Progressing

## 2023-11-16 ENCOUNTER — Encounter (HOSPITAL_COMMUNITY): Payer: Self-pay | Admitting: Nurse Practitioner

## 2023-11-16 DIAGNOSIS — F2081 Schizophreniform disorder: Secondary | ICD-10-CM | POA: Diagnosis not present

## 2023-11-16 DIAGNOSIS — Z8659 Personal history of other mental and behavioral disorders: Secondary | ICD-10-CM

## 2023-11-16 HISTORY — DX: Schizophreniform disorder: F20.81

## 2023-11-16 HISTORY — DX: Personal history of other mental and behavioral disorders: Z86.59

## 2023-11-16 LAB — GC/CHLAMYDIA PROBE AMP (~~LOC~~) NOT AT ARMC
Chlamydia: NEGATIVE
Comment: NEGATIVE
Comment: NORMAL
Neisseria Gonorrhea: NEGATIVE

## 2023-11-16 MED ORDER — TRAZODONE HCL 50 MG PO TABS: 50.0000 mg | ORAL_TABLET | Freq: Every day | ORAL | 0 refills | Status: AC

## 2023-11-16 MED ORDER — VITAMIN D (ERGOCALCIFEROL) 1.25 MG (50000 UNIT) PO CAPS: 50000.0000 [IU] | ORAL_CAPSULE | ORAL | 0 refills | Status: AC

## 2023-11-16 MED ORDER — HYDROXYZINE HCL 25 MG PO TABS: 25.0000 mg | ORAL_TABLET | Freq: Three times a day (TID) | ORAL | 0 refills | Status: AC | PRN

## 2023-11-16 MED ORDER — CHOLECALCIFEROL 125 MCG (5000 UT) PO TABS: 1.0000 | ORAL_TABLET | Freq: Every day | ORAL | 0 refills | Status: AC

## 2023-11-16 MED ORDER — MAGNESIUM OXIDE -MG SUPPLEMENT 400 (240 MG) MG PO TABS: 400.0000 mg | ORAL_TABLET | Freq: Every day | ORAL | 0 refills | Status: AC

## 2023-11-16 MED ORDER — VENLAFAXINE HCL ER 75 MG PO CP24: 75.0000 mg | ORAL_CAPSULE | Freq: Every day | ORAL | 0 refills | Status: AC

## 2023-11-16 MED ORDER — RISPERIDONE 3 MG PO TABS: 3.0000 mg | ORAL_TABLET | Freq: Every day | ORAL | 0 refills | Status: AC

## 2023-11-16 NOTE — [facility] Suicide Risk Assessment (Signed)
 [facility] Discharge Suicide Risk Assessment   Principal Problem: Schizophreniform disorder [facility])  Discharge Diagnoses: Principal Problem:   Schizophreniform disorder (HCC) Active Problems:   Major depressive disorder, recurrent, severe without psychotic features (HCC)   GAD (generalized anxiety disorder)   Nicotine dependence   Acute psychosis (HCC)   Insomnia      Total Time spent with patient: 40  Musculoskeletal: Strength & Muscle Tone: within normal limits Gait & Station: normal Patient leans: N/A   Psychiatric Specialty Exam:  Presentation  General Appearance: Appropriate for Environment  Eye Contact: Good  Speech: Clear and Coherent; Normal Rate  Speech Volume: Normal  Handedness: Right   Mood and Affect  Mood: Euthymic  Affect: Congruent   Thought Process  Thought Processes: Linear  Descriptions of Associations: Intact  Orientation: Full (Time, Place and Person)  Thought Content: Logical; Delusions  History of Schizophrenia/Schizoaffective disorder: No  Duration of Psychotic Symptoms: NA Hallucinations: Hallucinations: None  Ideas of Reference: None  Suicidal Thoughts: Suicidal Thoughts: No  Homicidal Thoughts: Homicidal Thoughts: No   Sensorium  Memory: Immediate Good  Judgment: Fair  Insight: Fair   Art therapist  Concentration: Good  Attention Span: Good  Recall: Good  Fund of Knowledge: Good  Language: Good   Psychomotor Activity  Psychomotor Activity: Psychomotor Activity: Normal   Assets  Assets: Communication Skills; Social Support; Housing; Leisure Time   Sleep  Sleep: Sleep: Good   Physical Exam: General: Sitting comfortably. NAD. HEENT: Normocephalic, atraumatic, MMM, EMOI Lungs: no increased work of breathing noted Heart: no cyanosis Abdomen: Non distended Musculoskeletal: FROM. No obvious deformities Skin: Warm, dry, intact. No rashes noted Neuro: No obvious focal deficits.  Gait and station  are normal  Review of Systems  Constitutional: Negative.   HENT: Negative.    Eyes: Negative.   Respiratory: Negative.    Cardiovascular: Negative.   Gastrointestinal: Negative.   Genitourinary: Negative.   Skin: Negative.   Neurological: Negative.   Psychiatric/Behavioral:  Negative   Mental Status Per Nursing Assessment: NA  Demographic Factors:  Adolescent or young adult, Caucasian, and Unemployed  Loss Factors: NA  Historical Factors: Family history of mental illness or substance abuse  Risk Reduction Factors:   Sense of responsibility to family, Living with another person, especially a relative, Positive social support, and Positive therapeutic relationship  Continued Clinical Symptoms:  Previous psychiatric diagnoses and treatments  Cognitive Features That Contribute To Risk:  None  Suicide Risk:  Minimal: No identifiable suicidal ideation.  Patients presenting with no risk factors but with morbid ruminations; may be classified as minimal risk based on the severity of the depressive symptoms.    Follow-up Information     [facility] Go on 12/03/2023.   Why: You have an appointment for medication management services on 12/03/23 at 3:00 pm .  This will be an in person appointment, but you may call to switch to Virtual telehealth. Contact information: [address] [place] [address] [phone]         [facility] Follow up on 11/15/2023.   Why: Please call this provider on 11/15/23 at 9:00 am to schedule an appointment for therapy services (patient must call to schedule appts, this is not an appt.) Contact information: [address] [place] [address] [phone]                  Plan Of Care/Follow-up recommendations:  Activity: as tolerated  Diet: heart healthy  Other: -Follow-up  with your outpatient psychiatric provider -instructions on appointment date, time, and address (location) are  provided to you in discharge paperwork.  -Take your psychiatric medications as prescribed at discharge - instructions are provided to you in the discharge paperwork  -Follow-up with outpatient primary care doctor and other specialists -for management of preventative medicine and chronic medical issues  -Testing: Follow-up with outpatient provider for any abnormal lab results (if any)  -If you are prescribed an atypical antipsychotic medication, we recommend that your outpatient psychiatrist follow routine screening for side effects within 3 months of discharge, including monitoring: AIMS scale, height, weight, blood pressure, fasting lipid panel, HbA1c, and fasting blood sugar.   -Recommend total abstinence from alcohol, tobacco, and other illicit drug use at discharge.   -If your psychiatric symptoms recur, worsen, or if you have side effects to your psychiatric medications, call your outpatient psychiatric provider, 911, [phone] or go to the nearest emergency department.  -If suicidal thoughts occur, immediately call your outpatient psychiatric provider, 911, [phone] or go to the nearest emergency department.   Clair Crews, MD 11/16/23 11:52 AM

## 2023-11-16 NOTE — Progress Notes (Signed)
  [facility] Adult Case Management Discharge Plan :  Will you be returning to the same living situation after discharge:  Yes,  pt is returning home at discharge  At discharge, do you have transportation home?: Yes,  pt is getting picked up by mom at 12:30PM Do you have the ability to pay for your medications: Yes,  pt has active health insurance coverage   Release of information consent forms completed and in the chart;  Patient's signature needed at discharge.  Patient to Follow up at:  Follow-up Information     [facility] Go on 12/03/2023.   Why: You have an appointment for medication management services on 12/03/23 at 3:00 pm .  This will be an in person appointment, but you may call to switch to Virtual telehealth. Contact information: [address] [place] [address] [phone]         [facility] Follow up on 11/15/2023.   Why: Please call this provider on 11/15/23 at 9:00 am to schedule an appointment for therapy services (patient must call to schedule appts, this is not an appt.) Contact information: [address] [place] [address] [phone]                 Next level of care provider has access to [facility] Link:no  Safety Planning and Suicide Prevention discussed: Yes,  Leida Puna (mother) [phone]     Has patient been referred to the Quitline?: Patient refused referral for treatment. Pt vapes nicotine, not interested in quitting at this time.   Patient has been referred for addiction treatment: No known substance use disorder.  Vonzell Guerin, LCSWA 11/16/2023, 10:12 AM

## 2023-11-16 NOTE — Group Note (Signed)
 Date:  11/16/2023 Time:  9:47 AM  Group Topic/Focus:  Goals Group:   The focus of this group is to help patients establish daily goals to achieve during treatment and discuss how the patient can incorporate goal setting into their daily lives to aide in recovery. Orientation:   The focus of this group is to educate the patient on the purpose and policies of crisis stabilization and provide a format to answer questions about their admission.  The group details unit policies and expectations of patients while admitted.    Participation Level:  Did Not Attend  Additional Comments:  Did not attend  Almarie Arias 11/16/2023, 9:47 AM

## 2023-11-16 NOTE — Progress Notes (Signed)
 Discharge Note:  Patient denies SI/HI/AVH at this time. Discharge instructions, AVS, prescriptions, and transition record gone over with patient. Patient agrees to adhere with medication management, follow-up visit, and outpatient therapy. Patient belongings returned to patient. Patient questions and concerns addressed and answered. Patient ambulatory off unit. Patient discharged to home with mom.
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
## 2023-11-16 NOTE — BH IP Treatment Plan (Unsigned)
 Interdisciplinary Treatment and Diagnostic Plan Update  11/15/2023 Time of Session: 1520 - UPDATE Modelle Vollmer MRN: [id number]  Principal Diagnosis: Schizophreniform disorder [facility])  Secondary Diagnoses: Principal Problem:   Schizophreniform disorder (HCC) Active Problems:   Major depressive disorder, recurrent, severe without psychotic features (HCC)   GAD (generalized anxiety disorder)   Nicotine dependence   Acute psychosis (HCC)   Insomnia   Current Medications:  Current Facility-Administered Medications  Medication Dose Route Frequency Provider Last Rate Last Admin   acetaminophen (TYLENOL) tablet 650 mg  650 mg Oral Q6H PRN Onuoha, Josephine C, NP       alum & mag hydroxide-simeth (MAALOX/MYLANTA) 200-200-20 MG/5ML suspension 30 mL  30 mL Oral Q4H PRN Onuoha, Josephine C, NP       haloperidol (HALDOL) tablet 5 mg  5 mg Oral TID PRN Onuoha, Josephine C, NP       And   diphenhydrAMINE (BENADRYL) capsule 50 mg  50 mg Oral TID PRN Onuoha, Josephine C, NP       haloperidol lactate (HALDOL) injection 5 mg  5 mg Intramuscular TID PRN Onuoha, Josephine C, NP       And   diphenhydrAMINE (BENADRYL) injection 50 mg  50 mg Intramuscular TID PRN Onuoha, Josephine C, NP       And   LORazepam (ATIVAN) injection 2 mg  2 mg Intramuscular TID PRN Onuoha, Josephine C, NP       haloperidol lactate (HALDOL) injection 10 mg  10 mg Intramuscular TID PRN Onuoha, Josephine C, NP       And   diphenhydrAMINE (BENADRYL) injection 50 mg  50 mg Intramuscular TID PRN Onuoha, Josephine C, NP       And   LORazepam (ATIVAN) injection 2 mg  2 mg Intramuscular TID PRN Onuoha, Josephine C, NP       hydrOXYzine (ATARAX) tablet 25 mg  25 mg Oral TID PRN Onuoha, Josephine C, NP   25 mg at 11/14/23 0825   magnesium oxide (MAG-OX) tablet 400 mg  400 mg Oral QHS Onuoha, Josephine C, NP   400 mg at 11/15/23 2108   nicotine (NICODERM CQ - dosed in mg/24 hours) patch 21 mg  21 mg Transdermal Daily Nkwenti, Arzella Laurence, NP    21 mg at 11/15/23 5284   nicotine polacrilex (NICORETTE) gum 2 mg  2 mg Oral PRN Linnie Riches, Nadir, MD   2 mg at 11/09/23 1514   risperiDONE (RISPERDAL) tablet 3 mg  3 mg Oral QHS Izediuno, Vincent A, MD   3 mg at 11/15/23 2108   traZODone (DESYREL) tablet 50 mg  50 mg Oral QHS Nkwenti, Doris, NP   50 mg at 11/15/23 2108   venlafaxine XR (EFFEXOR-XR) 24 hr capsule 75 mg  75 mg Oral Q breakfast Robet Chiquito, NP   75 mg at 11/16/23 1324   Vitamin D (Ergocalciferol) (DRISDOL) 1.25 MG (50000 UNIT) capsule 50,000 Units  50,000 Units Oral Q7 days Parker, Alvin S, MD   50,000 Units at 11/15/23 1609   vitamin D3 (CHOLECALCIFEROL) tablet 2,000 Units  2,000 Units Oral BID Parker, Alvin S, MD   2,000 Units at 11/16/23 0906   PTA Medications: Medications Prior to Admission  Medication Sig Dispense Refill Last Dose/Taking   Ibuprofen 200 MG CAPS Take 200-400 mg by mouth every 8 (eight) hours as needed (for pain or headaches).      norethindrone-ethinyl estradiol-FE (LOESTRIN FE) 1-20 MG-MCG tablet Take 1 tablet by mouth daily. (Patient not taking: Reported on 11/08/2023)  84 tablet 1     Patient Stressors: Marital or family conflict   Traumatic event    Patient Strengths: Average or above average intelligence  Motivation for treatment/growth   Treatment Modalities: Medication Management, Group therapy, Case management,  1 to 1 session with clinician, Psychoeducation, Recreational therapy.   Physician Treatment Plan for Primary Diagnosis: Schizophreniform disorder (HCC) Long Term Goal(s): Improvement in symptoms so as ready for discharge   Short Term Goals: Ability to identify changes in lifestyle to reduce recurrence of condition will improve Ability to verbalize feelings will improve Ability to disclose and discuss suicidal ideas Ability to demonstrate self-control will improve Ability to identify and develop effective coping behaviors will improve Ability to maintain clinical measurements within  normal limits will improve Compliance with prescribed medications will improve  Medication Management: Evaluate patient's response, side effects, and tolerance of medication regimen.  Therapeutic Interventions: 1 to 1 sessions, Unit Group sessions and Medication administration.  Evaluation of Outcomes: Progressing  Physician Treatment Plan for Secondary Diagnosis: Principal Problem:   Schizophreniform disorder (HCC) Active Problems:   Major depressive disorder, recurrent, severe without psychotic features (HCC)   GAD (generalized anxiety disorder)   Nicotine dependence   Acute psychosis (HCC)   Insomnia  Long Term Goal(s): Improvement in symptoms so as ready for discharge   Short Term Goals: Ability to identify changes in lifestyle to reduce recurrence of condition will improve Ability to verbalize feelings will improve Ability to disclose and discuss suicidal ideas Ability to demonstrate self-control will improve Ability to identify and develop effective coping behaviors will improve Ability to maintain clinical measurements within normal limits will improve Compliance with prescribed medications will improve     Medication Management: Evaluate patient's response, side effects, and tolerance of medication regimen.  Therapeutic Interventions: 1 to 1 sessions, Unit Group sessions and Medication administration.  Evaluation of Outcomes: Progressing   RN Treatment Plan for Primary Diagnosis: Schizophreniform disorder (HCC) Long Term Goal(s): Knowledge of disease and therapeutic regimen to maintain health will improve  Short Term Goals: Ability to verbalize frustration and anger appropriately will improve, Ability to demonstrate self-control, and Ability to participate in decision making will improve  Medication Management: RN will administer medications as ordered by provider, will assess and evaluate patient's response and provide education to patient for prescribed medication.  RN will report any adverse and/or side effects to prescribing provider.  Therapeutic Interventions: 1 on 1 counseling sessions, Psychoeducation, Medication administration, Evaluate responses to treatment, Monitor vital signs and CBGs as ordered, Perform/monitor CIWA, COWS, AIMS and Fall Risk screenings as ordered, Perform wound care treatments as ordered.  Evaluation of Outcomes: Progressing   LCSW Treatment Plan for Primary Diagnosis: Schizophreniform disorder [facility]) Long Term Goal(s): Safe transition to appropriate next level of care at discharge, Engage patient in therapeutic group addressing interpersonal concerns.  Short Term Goals: Engage patient in aftercare planning with referrals and resources, Increase ability to appropriately verbalize feelings, and Facilitate acceptance of mental health diagnosis and concerns  Therapeutic Interventions: Assess for all discharge needs, 1 to 1 time with Social worker, Explore available resources and support systems, Assess for adequacy in community support network, Educate family and significant other(s) on suicide prevention, Complete Psychosocial Assessment, Interpersonal group therapy.  Evaluation of Outcomes: Progressing   Progress in Treatment: Attending groups: No. Participating in groups: No. Taking medication as prescribed: Yes. Toleration medication: Yes. Family/Significant other contact made: No, will contact:  pt declined consents, completed w patient Patient understands diagnosis: Yes. Discussing patient  identified problems/goals with staff: Yes. Medical problems stabilized or resolved: Yes. Denies suicidal/homicidal ideation: Yes. Issues/concerns per patient self-inventory: No.   New problem(s) identified: No, Describe:  none   New Short Term/Long Term Goal(s): medication stabilization, elimination of SI thoughts, development of comprehensive mental wellness plan.      Patient Goals:  "Attend more groups and work on my social  anxiety"   Discharge Plan or Barriers: Patient recently admitted. CSW will continue to follow and assess for appropriate referrals and possible discharge planning.      Reason for Continuation of Hospitalization: Depression Medication stabilization Suicidal ideation Other; describe paranoia   Estimated Length of Stay: 1-3 DAYS Last 3 [place] Suicide Severity Risk Score: Flowsheet Row [facility] (Current[facility]07/2023 in [facility]  C-SSRS RISK CATEGORY Moderate Risk High Risk       Last [facility] 2/9 Scores:     No data to display          Scribe for Treatment Team: Ladarious Kresse N Micky Overturf, LCSW 11/16/2023 9:48 AM

## 2023-11-16 NOTE — Progress Notes (Signed)
   11/16/23 0900  Psych Admission Type (Psych Patients Only)  Admission Status Voluntary  Psychosocial Assessment  Patient Complaints Anxiety  Eye Contact Fair  Facial Expression Anxious  Affect Appropriate to circumstance  Speech Logical/coherent  Interaction Assertive  Motor Activity Slow  Appearance/Hygiene Unremarkable  Behavior Characteristics Cooperative  Mood Pleasant  Thought Process  Coherency WDL  Content WDL  Delusions None reported or observed  Perception WDL  Hallucination None reported or observed  Judgment Impaired  Confusion None  Danger to Self  Current suicidal ideation? Denies  Danger to Others  Danger to Others None reported or observed

## 2023-11-18 NOTE — Progress Notes (Signed)
  Adriana Cropper   Pt's mother called this Clinical research associate and left 3 vm regarding where pt is to follow up for therapy appointment post discharge. Chart opened to call mother back and provide this information for continuity of care.  Signed:  Wilmer Berryhill, LCSW-A 11/18/2023  11:14 AM

## 2024-04-25 ENCOUNTER — Ambulatory Visit (HOSPITAL_COMMUNITY)
Admission: EM | Admit: 2024-04-25 | Discharge: 2024-04-25 | Disposition: A | Discharge status: Home / Self Care | Attending: Physician Assistant | Admitting: Physician Assistant

## 2024-04-25 ENCOUNTER — Encounter (HOSPITAL_COMMUNITY): Payer: Self-pay

## 2024-04-25 DIAGNOSIS — Z113 Encounter for screening for infections with a predominantly sexual mode of transmission: Secondary | ICD-10-CM | POA: Diagnosis not present

## 2024-04-25 DIAGNOSIS — R300 Dysuria: Secondary | ICD-10-CM | POA: Insufficient documentation

## 2024-04-25 LAB — POCT URINALYSIS DIP (MANUAL ENTRY)
Bilirubin, UA: NEGATIVE
Glucose, UA: NEGATIVE mg/dL
Ketones, POC UA: NEGATIVE mg/dL
Nitrite, UA: NEGATIVE
Protein Ur, POC: 100 mg/dL — AB
Spec Grav, UA: 1.02 (ref 1.010–1.025)
Urobilinogen, UA: 0.2 U/dL
pH, UA: 6.5 (ref 5.0–8.0)

## 2024-04-25 LAB — HIV ANTIBODY (ROUTINE TESTING W REFLEX): HIV Screen 4th Generation wRfx: NONREACTIVE

## 2024-04-25 LAB — POCT URINE PREGNANCY: Preg Test, Ur: NEGATIVE

## 2024-04-25 MED ORDER — NITROFURANTOIN MONOHYD MACRO 100 MG PO CAPS: 100.0000 mg | ORAL_CAPSULE | Freq: Two times a day (BID) | ORAL | 0 refills | Status: AC

## 2024-04-25 NOTE — ED Provider Notes (Signed)
 [facility]    CSN: [id number] Arrival date & time: 04/25/24  1744      History   Chief Complaint Chief Complaint  Patient presents with   Urinary Tract Infection   SEXUALLY TRANSMITTED DISEASE    HPI Julie Mcclure is a 23 y.o. female.  has a past medical history of Depression, Dysmenorrhea, H/O psychiatric hospitalization (11/16/2023), and Schizophreniform disorder (HCC) (11/16/2023).   HPI   Discussed the use of AI scribe software for clinical note transcription with the patient, who gave verbal consent to proceed.  he patient presents with symptoms suggestive of a urinary tract infection and requests an STD test.  She has been experiencing burning during urination, inability to control her bladder, abdominal and back pain, and suprapubic tenderness for several days. She has been using Azo intermittently for symptom relief.  She notes pain when pressure is applied to her back and observed blood in her urine earlier today. No fever or chills, but she feels lightheaded. She reports nausea, vomiting, and diarrhea.  She is sexually active with one female partner and has not been informed of any STD exposure or symptoms by her partner.  She denies any known allergies. She is unsure if she has a history of yeast infections when on antibiotics.   Past Medical History:  Diagnosis Date   Depression    Dysmenorrhea    H/O psychiatric hospitalization 11/16/2023   Schizophreniform disorder (HCC) 11/16/2023    Patient Active Problem List   Diagnosis Date Noted   Schizophreniform disorder (HCC) 11/14/2023   GAD (generalized anxiety disorder) 11/09/2023   Nicotine  dependence 11/09/2023   Acute psychosis (HCC) 11/09/2023   Insomnia 11/09/2023   PTSD (post-traumatic stress disorder) 11/08/2023   Severe episode of recurrent major depressive disorder, with psychotic features (HCC) 11/08/2023   Suicidal ideation 11/08/2023   Major depressive disorder, recurrent, severe  without psychotic features (HCC) 11/08/2023    Past Surgical History:  Procedure Laterality Date   HERNIA REPAIR      OB History     Gravida  0   Para  0   Term  0   Preterm  0   AB  0   Living  0      SAB  0   IAB  0   Ectopic  0   Multiple  0   Live Births  0            Home Medications    Prior to Admission medications   Medication Sig Start Date End Date Taking? Authorizing Provider  nitrofurantoin , macrocrystal-monohydrate, (MACROBID ) 100 MG capsule Take 1 capsule (100 mg total) by mouth 2 (two) times daily for 5 days. 04/25/24 04/30/24 Yes Ariah Mower E, PA-C  Cholecalciferol  125 MCG (5000 UT) TABS Take 1 tablet (5,000 Units total) by mouth daily. 11/16/23   Kennyth Starleen RAMAN, MD  hydrOXYzine  (ATARAX ) 25 MG tablet Take 1 tablet (25 mg total) by mouth 3 (three) times daily as needed for anxiety. 11/16/23   Kennyth Starleen RAMAN, MD  magnesium  oxide (MAG-OX) 400 (240 Mg) MG tablet Take 1 tablet (400 mg total) by mouth at bedtime. 11/16/23   Kennyth Starleen RAMAN, MD  risperiDONE  (RISPERDAL ) 3 MG tablet Take 1 tablet (3 mg total) by mouth at bedtime. 11/16/23   Kennyth Starleen RAMAN, MD  traZODone  (DESYREL ) 50 MG tablet Take 1 tablet (50 mg total) by mouth at bedtime. 11/16/23   Kennyth Starleen RAMAN, MD  venlafaxine  XR (EFFEXOR -XR) 75 MG 24  hr capsule Take 1 capsule (75 mg total) by mouth daily with breakfast. 11/17/23   Kennyth Starleen RAMAN, MD  Vitamin D , Ergocalciferol , (DRISDOL ) 1.25 MG (50000 UNIT) CAPS capsule Take 1 capsule (50,000 Units total) by mouth every 7 (seven) days. 11/22/23   Kennyth Starleen RAMAN, MD    Family History Family History  Problem Relation Age of Onset   Breast cancer Neg Hx    Ovarian cancer Neg Hx     Social History Social History   Tobacco Use   Smoking status: Never   Smokeless tobacco: Never  Vaping Use   Vaping status: Every Day   Substances: Nicotine   Substance Use Topics   Alcohol use: Yes   Drug use: Never     Allergies   Patient has no known  allergies.   Review of Systems Review of Systems  Constitutional:  Negative for chills and fever.  Gastrointestinal:  Positive for abdominal pain (right sided abdominal pain with radiation to the right flank, suprapubic pain as well). Negative for diarrhea, nausea and vomiting.  Genitourinary:  Positive for dysuria, flank pain, frequency, hematuria and urgency. Negative for vaginal bleeding, vaginal discharge and vaginal pain.  Neurological:  Positive for light-headedness.     Physical Exam Triage Vital Signs ED Triage Vitals  Encounter Vitals Group     BP 04/25/24 1855 114/77     Girls Systolic BP Percentile --      Girls Diastolic BP Percentile --      Boys Systolic BP Percentile --      Boys Diastolic BP Percentile --      Pulse Rate 04/25/24 1855 71     Resp 04/25/24 1855 16     Temp 04/25/24 1855 98.3 F (36.8 C)     Temp src --      SpO2 04/25/24 1855 97 %     Weight --      Height --      Head Circumference --      Peak Flow --      Pain Score 04/25/24 1853 3     Pain Loc --      Pain Education --      Exclude from Growth Chart --    No data found.  Updated Vital Signs BP 114/77 (BP Location: Left Arm)   Pulse 71   Temp 98.3 F (36.8 C)   Resp 16   LMP 04/04/2024 (Approximate)   SpO2 97%   Visual Acuity Right Eye Distance:   Left Eye Distance:   Bilateral Distance:    Right Eye Near:   Left Eye Near:    Bilateral Near:     Physical Exam Vitals reviewed.  Constitutional:      General: She is awake.     Appearance: Normal appearance. She is well-developed and well-groomed.  HENT:     Head: Normocephalic and atraumatic.  Eyes:     General: Lids are normal. Gaze aligned appropriately.     Extraocular Movements: Extraocular movements intact.     Conjunctiva/sclera: Conjunctivae normal.  Pulmonary:     Effort: Pulmonary effort is normal.  Neurological:     Mental Status: She is alert and oriented to person, place, and time.  Psychiatric:         Attention and Perception: Attention and perception normal.        Mood and Affect: Mood and affect normal.        Speech: Speech normal.  Behavior: Behavior normal. Behavior is cooperative.      UC Treatments / Results  Labs (all labs ordered are listed, but only abnormal results are displayed) Labs Reviewed  POCT URINALYSIS DIP (MANUAL ENTRY) - Abnormal; Notable for the following components:      Result Value   Clarity, UA turbid (*)    Blood, UA moderate (*)    Protein Ur, POC =100 (*)    Leukocytes, UA Small (1+) (*)    All other components within normal limits  URINE CULTURE  RPR  HIV ANTIBODY (ROUTINE TESTING W REFLEX)  POCT URINE PREGNANCY  CERVICOVAGINAL ANCILLARY ONLY    EKG   Radiology No results found.  Procedures Procedures (including critical care time)  Medications Ordered in UC Medications - No data to display  Initial Impression / Assessment and Plan / UC Course  I have reviewed the triage vital signs and the nursing notes.  Pertinent labs & imaging results that were available during my care of the patient were reviewed by me and considered in my medical decision making (see chart for details).      Final Clinical Impressions(s) / UC Diagnoses   Final diagnoses:  Screening examination for STD (sexually transmitted disease)  Dysuria     Urinary tract infection Symptoms consistent with a urinary tract infection, including dysuria, urinary frequency, urgency, suprapubic tenderness, and hematuria since Friday. No fever or chills. Urine analysis shows white blood cells and blood. Negative urine pregnancy test. Blood in urine likely due to irritation from bacterial infection, a common finding in UTIs. - Send urine for culture - Prescribe Macrobid  for UTI - Advise to eat before taking the antibiotic to prevent stomach upset - If urine culture shows resistance to Macrobid , send a new antibiotic prescription  Screening for sexually transmitted  infections Requests STD testing. Currently sexually active with one female partner. No symptoms of STD reported by her or partner. No known exposure to STDs. - Perform swab for gonorrhea, chlamydia, trichomonas, BV, and yeast - Order blood work for HIV and syphilis - Inform that positive results will be communicated by phone, and all results will be available on MyChart - Expect results in 1-3 days, with blood work likely available by tomorrow     Discharge Instructions      Based on your symptoms and results of the urinalysis I believe you have a UTI I recommend the following:  I have sent in a script for Macrobid  100 mg PO BID x 5 days Please finish the entire course of the antibiotic even if you are feeling better before it is completed unless you develop an allergic reaction or are told by a medical provider to stop taking it. We have sent a sample of your urine off for a urine culture.  This will help us  determine what bacteria is causing your symptoms as well as the most appropriate antibiotic to treat it.  If we need to make any adjustments to your medication regimen and new medication will be sent to the pharmacy on file and you will be updated via phone call in MyChart. Stay well hydrated (at least 75 oz of water per day) and avoid holding your urine for prolonged periods of time. If you have any of the following please return to urgent care or go to the emergency room: Persistent symptoms, fever, trouble urinating or inability to urinate, confusion, flank pain.   You were seen today for STD screening.  We collected a cervicovaginal swab that  we will assess for gonorrhea, chlamydia, trichomonas, bacterial vaginosis, yeast.  If collected blood work that we will assess for HIV and syphilis.  We will keep you updated with these results once they are available.  If any medications are indicated by those test results we will call you and medications will either be sent to the pharmacy on file  or you can return to the urgent care for an injection.  It is recommended that you refrain from sexual activity until your test results are negative or until you have completed an appropriate medication regimen as dictated by your test results.  Please use a condom or another barrier method to help prevent STD transmission.  Please make sure that you communicate with your partners regarding your test results should any positive results, about as they will also need to be tested and screened.      ED Prescriptions     Medication Sig Dispense Auth. Provider   nitrofurantoin , macrocrystal-monohydrate, (MACROBID ) 100 MG capsule Take 1 capsule (100 mg total) by mouth 2 (two) times daily for 5 days. 10 capsule Kregg Cihlar E, PA-C      PDMP not reviewed this encounter.   Cerissa Zeiger E, PA-C 04/25/24 1928

## 2024-04-25 NOTE — Discharge Instructions (Addendum)
 Based on your symptoms and results of the urinalysis I believe you have a UTI I recommend the following:  I have sent in a script for Macrobid  100 mg PO BID x 5 days Please finish the entire course of the antibiotic even if you are feeling better before it is completed unless you develop an allergic reaction or are told by a medical provider to stop taking it. We have sent a sample of your urine off for a urine culture.  This will help us  determine what bacteria is causing your symptoms as well as the most appropriate antibiotic to treat it.  If we need to make any adjustments to your medication regimen and new medication will be sent to the pharmacy on file and you will be updated via phone call in MyChart. Stay well hydrated (at least 75 oz of water per day) and avoid holding your urine for prolonged periods of time. If you have any of the following please return to urgent care or go to the emergency room: Persistent symptoms, fever, trouble urinating or inability to urinate, confusion, flank pain.   You were seen today for STD screening.  We collected a cervicovaginal swab that we will assess for gonorrhea, chlamydia, trichomonas, bacterial vaginosis, yeast.  If collected blood work that we will assess for HIV and syphilis.  We will keep you updated with these results once they are available.  If any medications are indicated by those test results we will call you and medications will either be sent to the pharmacy on file or you can return to the urgent care for an injection.  It is recommended that you refrain from sexual activity until your test results are negative or until you have completed an appropriate medication regimen as dictated by your test results.  Please use a condom or another barrier method to help prevent STD transmission.  Please make sure that you communicate with your partners regarding your test results should any positive results, about as they will also need to be tested and  screened.

## 2024-04-25 NOTE — ED Triage Notes (Signed)
 Pt c/o burning on urination with urgency and frequency with lower back pain since Friday. Took AZO with no relief.   Requesting STD testing with blood work. Denies sx's

## 2024-04-26 LAB — CERVICOVAGINAL ANCILLARY ONLY
Bacterial Vaginitis (gardnerella): NEGATIVE
Candida Glabrata: NEGATIVE
Candida Vaginitis: NEGATIVE
Chlamydia: NEGATIVE
Comment: NEGATIVE
Comment: NEGATIVE
Comment: NEGATIVE
Comment: NEGATIVE
Comment: NEGATIVE
Comment: NORMAL
Neisseria Gonorrhea: NEGATIVE
Trichomonas: NEGATIVE

## 2024-04-26 LAB — RPR: RPR Ser Ql: NONREACTIVE

## 2024-04-27 ENCOUNTER — Ambulatory Visit (HOSPITAL_COMMUNITY): Payer: Self-pay

## 2024-04-27 LAB — URINE CULTURE: Culture: >=100000 — AB
